# Patient Record
Sex: Female | Born: 1975
Health system: Southern US, Community
[De-identification: ages and names within clinical notes are randomized; demographics above are authoritative.]

## PROBLEM LIST (undated history)

## (undated) DIAGNOSIS — K219 Gastro-esophageal reflux disease without esophagitis: Secondary | ICD-10-CM

## (undated) DIAGNOSIS — K5909 Other constipation: Secondary | ICD-10-CM

## (undated) DIAGNOSIS — Z8619 Personal history of other infectious and parasitic diseases: Secondary | ICD-10-CM

## (undated) DIAGNOSIS — R739 Hyperglycemia, unspecified: Secondary | ICD-10-CM

## (undated) DIAGNOSIS — K589 Irritable bowel syndrome without diarrhea: Secondary | ICD-10-CM

## (undated) DIAGNOSIS — T7840XA Allergy, unspecified, initial encounter: Secondary | ICD-10-CM

## (undated) HISTORY — PX: OTHER SURGICAL HISTORY: SHX169

## (undated) HISTORY — PX: COLONOSCOPY: SHX174

## (undated) HISTORY — DX: Gastro-esophageal reflux disease without esophagitis: K21.9

## (undated) HISTORY — DX: Personal history of other infectious and parasitic diseases: Z86.19

## (undated) HISTORY — DX: Hyperglycemia, unspecified: R73.9

## (undated) HISTORY — PX: BREAST REDUCTION SURGERY: SHX8

## (undated) HISTORY — DX: Irritable bowel syndrome, unspecified: K58.9

## (undated) HISTORY — DX: Other constipation: K59.09

## (undated) HISTORY — DX: Allergy, unspecified, initial encounter: T78.40XA

---

## 2000-01-04 HISTORY — PX: BREAST SURGERY: SHX581

## 2000-02-17 ENCOUNTER — Encounter: Payer: Self-pay | Admitting: Family Medicine

## 2000-02-17 ENCOUNTER — Encounter: Admission: RE | Admit: 2000-02-17 | Discharge: 2000-02-17 | Payer: Self-pay | Admitting: Family Medicine

## 2000-05-22 ENCOUNTER — Encounter (INDEPENDENT_AMBULATORY_CARE_PROVIDER_SITE_OTHER): Payer: Self-pay

## 2000-05-22 ENCOUNTER — Other Ambulatory Visit: Admission: RE | Admit: 2000-05-22 | Discharge: 2000-05-22 | Payer: Self-pay | Admitting: Plastic Surgery

## 2001-02-13 ENCOUNTER — Other Ambulatory Visit: Admission: RE | Admit: 2001-02-13 | Discharge: 2001-02-13 | Payer: Self-pay | Admitting: Obstetrics and Gynecology

## 2001-07-27 ENCOUNTER — Emergency Department (HOSPITAL_COMMUNITY): Admission: EM | Admit: 2001-07-27 | Discharge: 2001-07-28 | Payer: Self-pay | Admitting: Emergency Medicine

## 2001-07-28 ENCOUNTER — Encounter: Payer: Self-pay | Admitting: Emergency Medicine

## 2001-08-31 ENCOUNTER — Ambulatory Visit (HOSPITAL_COMMUNITY): Admission: RE | Admit: 2001-08-31 | Discharge: 2001-08-31 | Payer: Self-pay | Admitting: Family Medicine

## 2001-08-31 ENCOUNTER — Encounter: Payer: Self-pay | Admitting: Family Medicine

## 2002-01-03 HISTORY — PX: TONSILLECTOMY: SUR1361

## 2002-03-04 ENCOUNTER — Other Ambulatory Visit: Admission: RE | Admit: 2002-03-04 | Discharge: 2002-03-04 | Payer: Self-pay | Admitting: Obstetrics and Gynecology

## 2002-04-03 ENCOUNTER — Encounter: Payer: Self-pay | Admitting: Family Medicine

## 2002-04-03 ENCOUNTER — Ambulatory Visit (HOSPITAL_COMMUNITY): Admission: RE | Admit: 2002-04-03 | Discharge: 2002-04-03 | Payer: Self-pay | Admitting: Family Medicine

## 2002-06-29 ENCOUNTER — Emergency Department (HOSPITAL_COMMUNITY): Admission: EM | Admit: 2002-06-29 | Discharge: 2002-06-29 | Payer: Self-pay

## 2002-11-01 ENCOUNTER — Ambulatory Visit (HOSPITAL_COMMUNITY): Admission: RE | Admit: 2002-11-01 | Discharge: 2002-11-01 | Payer: Self-pay | Admitting: Family Medicine

## 2002-12-31 ENCOUNTER — Ambulatory Visit (HOSPITAL_COMMUNITY): Admission: RE | Admit: 2002-12-31 | Discharge: 2002-12-31 | Payer: Self-pay | Admitting: Family Medicine

## 2003-04-25 ENCOUNTER — Other Ambulatory Visit: Admission: RE | Admit: 2003-04-25 | Discharge: 2003-04-25 | Payer: Self-pay | Admitting: Obstetrics and Gynecology

## 2003-05-13 ENCOUNTER — Other Ambulatory Visit: Admission: RE | Admit: 2003-05-13 | Discharge: 2003-05-13 | Payer: Self-pay | Admitting: Obstetrics and Gynecology

## 2004-01-04 HISTORY — PX: OTHER SURGICAL HISTORY: SHX169

## 2004-01-28 ENCOUNTER — Ambulatory Visit: Payer: Self-pay | Admitting: Family Medicine

## 2004-03-03 ENCOUNTER — Ambulatory Visit (HOSPITAL_COMMUNITY): Admission: RE | Admit: 2004-03-03 | Discharge: 2004-03-03 | Payer: Self-pay | Admitting: Podiatry

## 2004-05-17 ENCOUNTER — Emergency Department (HOSPITAL_COMMUNITY): Admission: EM | Admit: 2004-05-17 | Discharge: 2004-05-17 | Payer: Self-pay | Admitting: Emergency Medicine

## 2004-05-27 ENCOUNTER — Ambulatory Visit: Payer: Self-pay | Admitting: Family Medicine

## 2004-08-23 ENCOUNTER — Ambulatory Visit: Payer: Self-pay | Admitting: Family Medicine

## 2004-08-24 ENCOUNTER — Other Ambulatory Visit: Admission: RE | Admit: 2004-08-24 | Discharge: 2004-08-24 | Payer: Self-pay | Admitting: Obstetrics and Gynecology

## 2004-09-02 ENCOUNTER — Other Ambulatory Visit: Admission: RE | Admit: 2004-09-02 | Discharge: 2004-09-02 | Payer: Self-pay | Admitting: Obstetrics and Gynecology

## 2004-10-08 ENCOUNTER — Ambulatory Visit: Payer: Self-pay | Admitting: Family Medicine

## 2005-03-10 ENCOUNTER — Ambulatory Visit: Payer: Self-pay | Admitting: Family Medicine

## 2005-06-30 ENCOUNTER — Encounter: Payer: Self-pay | Admitting: Family Medicine

## 2005-06-30 ENCOUNTER — Ambulatory Visit: Payer: Self-pay | Admitting: Family Medicine

## 2005-06-30 ENCOUNTER — Encounter (INDEPENDENT_AMBULATORY_CARE_PROVIDER_SITE_OTHER): Payer: Self-pay | Admitting: *Deleted

## 2005-06-30 ENCOUNTER — Other Ambulatory Visit: Admission: RE | Admit: 2005-06-30 | Discharge: 2005-06-30 | Payer: Self-pay | Admitting: Family Medicine

## 2005-09-01 ENCOUNTER — Ambulatory Visit: Payer: Self-pay | Admitting: Family Medicine

## 2006-04-10 ENCOUNTER — Ambulatory Visit: Payer: Self-pay | Admitting: Family Medicine

## 2006-04-10 LAB — CONVERTED CEMR LAB
Basophils Absolute: 0.2 10*3/uL — ABNORMAL HIGH (ref 0.0–0.1)
Basophils Relative: 2 % — ABNORMAL HIGH (ref 0–1)
CO2: 24 meq/L (ref 19–32)
Calcium: 8.7 mg/dL (ref 8.4–10.5)
Chloride: 105 meq/L (ref 96–112)
Creatinine, Ser: 0.76 mg/dL (ref 0.40–1.20)
Eosinophils Absolute: 0.1 10*3/uL (ref 0.0–0.7)
HCT: 40.9 % (ref 36.0–46.0)
HDL: 40 mg/dL (ref >39)
LDL Cholesterol: 77 mg/dL (ref 0–99)
MCV: 83.6 fL (ref 78.0–100.0)
Platelets: 509 10*3/uL — ABNORMAL HIGH (ref 150–400)
Potassium: 4.5 meq/L (ref 3.5–5.3)
RBC: 4.89 M/uL (ref 3.87–5.11)
Total CHOL/HDL Ratio: 3.3
VLDL: 13 mg/dL (ref 0–40)

## 2006-05-17 ENCOUNTER — Ambulatory Visit: Payer: Self-pay | Admitting: Family Medicine

## 2006-06-14 ENCOUNTER — Encounter (HOSPITAL_COMMUNITY): Admission: RE | Admit: 2006-06-14 | Discharge: 2006-07-14 | Payer: Self-pay | Admitting: Oncology

## 2006-06-14 ENCOUNTER — Ambulatory Visit (HOSPITAL_COMMUNITY): Payer: Self-pay | Admitting: Oncology

## 2006-07-27 ENCOUNTER — Ambulatory Visit: Payer: Self-pay | Admitting: Family Medicine

## 2006-09-20 ENCOUNTER — Ambulatory Visit (HOSPITAL_COMMUNITY): Payer: Self-pay | Admitting: Oncology

## 2006-09-20 ENCOUNTER — Encounter (HOSPITAL_COMMUNITY): Admission: RE | Admit: 2006-09-20 | Discharge: 2006-10-03 | Payer: Self-pay | Admitting: Oncology

## 2006-11-02 ENCOUNTER — Ambulatory Visit: Payer: Self-pay | Admitting: Family Medicine

## 2006-11-16 ENCOUNTER — Encounter (INDEPENDENT_AMBULATORY_CARE_PROVIDER_SITE_OTHER): Payer: Self-pay | Admitting: *Deleted

## 2006-11-16 ENCOUNTER — Ambulatory Visit: Payer: Self-pay | Admitting: Family Medicine

## 2006-11-16 ENCOUNTER — Encounter: Payer: Self-pay | Admitting: Family Medicine

## 2006-11-16 ENCOUNTER — Other Ambulatory Visit: Admission: RE | Admit: 2006-11-16 | Discharge: 2006-11-16 | Payer: Self-pay | Admitting: Family Medicine

## 2006-12-01 ENCOUNTER — Emergency Department (HOSPITAL_COMMUNITY): Admission: EM | Admit: 2006-12-01 | Discharge: 2006-12-01 | Payer: Self-pay | Admitting: Emergency Medicine

## 2007-05-23 ENCOUNTER — Ambulatory Visit: Payer: Self-pay | Admitting: Family Medicine

## 2007-05-29 ENCOUNTER — Encounter (INDEPENDENT_AMBULATORY_CARE_PROVIDER_SITE_OTHER): Payer: Self-pay | Admitting: *Deleted

## 2007-05-29 DIAGNOSIS — E669 Obesity, unspecified: Secondary | ICD-10-CM | POA: Insufficient documentation

## 2007-05-29 DIAGNOSIS — K219 Gastro-esophageal reflux disease without esophagitis: Secondary | ICD-10-CM | POA: Insufficient documentation

## 2007-05-29 DIAGNOSIS — K5900 Constipation, unspecified: Secondary | ICD-10-CM | POA: Insufficient documentation

## 2007-08-14 ENCOUNTER — Telehealth: Payer: Self-pay | Admitting: Family Medicine

## 2007-11-05 ENCOUNTER — Ambulatory Visit: Payer: Self-pay | Admitting: Family Medicine

## 2007-11-13 ENCOUNTER — Ambulatory Visit: Payer: Self-pay | Admitting: Family Medicine

## 2007-11-13 DIAGNOSIS — J019 Acute sinusitis, unspecified: Secondary | ICD-10-CM | POA: Insufficient documentation

## 2007-11-13 DIAGNOSIS — M549 Dorsalgia, unspecified: Secondary | ICD-10-CM | POA: Insufficient documentation

## 2007-11-13 DIAGNOSIS — J0120 Acute ethmoidal sinusitis, unspecified: Secondary | ICD-10-CM | POA: Insufficient documentation

## 2007-11-13 DIAGNOSIS — N912 Amenorrhea, unspecified: Secondary | ICD-10-CM | POA: Insufficient documentation

## 2007-12-17 ENCOUNTER — Telehealth: Payer: Self-pay | Admitting: Family Medicine

## 2008-01-02 ENCOUNTER — Telehealth: Payer: Self-pay | Admitting: Family Medicine

## 2008-07-16 ENCOUNTER — Ambulatory Visit: Payer: Self-pay | Admitting: Family Medicine

## 2008-07-16 DIAGNOSIS — J209 Acute bronchitis, unspecified: Secondary | ICD-10-CM | POA: Insufficient documentation

## 2008-12-11 ENCOUNTER — Other Ambulatory Visit: Admission: RE | Admit: 2008-12-11 | Discharge: 2008-12-11 | Payer: Self-pay | Admitting: Family Medicine

## 2008-12-11 ENCOUNTER — Ambulatory Visit: Payer: Self-pay | Admitting: Family Medicine

## 2008-12-11 DIAGNOSIS — R5381 Other malaise: Secondary | ICD-10-CM | POA: Insufficient documentation

## 2008-12-11 DIAGNOSIS — I889 Nonspecific lymphadenitis, unspecified: Secondary | ICD-10-CM | POA: Insufficient documentation

## 2008-12-11 DIAGNOSIS — R5383 Other fatigue: Secondary | ICD-10-CM

## 2008-12-11 LAB — CONVERTED CEMR LAB: Beta hcg, urine, semiquantitative: NEGATIVE

## 2009-06-24 ENCOUNTER — Emergency Department (HOSPITAL_COMMUNITY): Admission: EM | Admit: 2009-06-24 | Discharge: 2009-06-24 | Payer: Self-pay | Admitting: Emergency Medicine

## 2009-06-25 ENCOUNTER — Telehealth: Payer: Self-pay | Admitting: Family Medicine

## 2009-12-03 ENCOUNTER — Telehealth: Payer: Self-pay | Admitting: Family Medicine

## 2010-01-24 ENCOUNTER — Encounter: Payer: Self-pay | Admitting: Family Medicine

## 2010-02-02 NOTE — Progress Notes (Signed)
  Phone Note Call from Patient   Caller: Patient Summary of Call: patient states she has glands in her private area swollen on each side, started two days ago patient is taking monistat for yeast, what could cause the swelling Initial call taken by: Adella Hare LPN,  December 03, 2009 4:44 PM  Follow-up for Phone Call        swollen glands happen fighting infection, and should go down once her infection clears, pls let her know Follow-up by: Syliva Overman MD,  December 03, 2009 5:38 PM  Additional Follow-up for Phone Call Additional follow up Details #1::        returned call, left message Additional Follow-up by: Adella Hare LPN,  December 04, 2009 3:49 PM    Additional Follow-up for Phone Call Additional follow up Details #2::    patient aware Follow-up by: Adella Hare LPN,  December 08, 2009 12:56 PM

## 2010-02-02 NOTE — Progress Notes (Signed)
Summary: speak with nurse  Phone Note Call from Patient   Summary of Call: [phone] needs to speak doc about going to er and diag with anixety  Initial call taken by: Rudene Anda,  June 25, 2009 8:42 AM  Follow-up for Phone Call        pls find out wghat she wants me to hear and ley me know,  Follow-up by: Syliva Overman MD,  June 25, 2009 12:00 PM  Additional Follow-up for Phone Call Additional follow up Details #1::        returned call, no answer Additional Follow-up by: Adella Hare LPN,  June 25, 2009 12:09 PM    Additional Follow-up for Phone Call Additional follow up Details #2::    patient states she does not need anything at this time and will call in future for follow up Follow-up by: Adella Hare LPN,  June 26, 2009 2:20 PM

## 2010-02-02 NOTE — Progress Notes (Signed)
Summary: Please advise  Phone Note Call from Patient   Summary of Call: Patient states she is itching in her vaginal area it started yesterday, no pain during urination.  Please advise. Initial call taken by: Curtis Sites,  December 03, 2009 8:12 AM  Follow-up for Phone Call        if itching only and no discharge or odor needs to try otc monistat, if this does not help she needs ov  called patient, left message Follow-up by: Adella Hare LPN,  December 03, 2009 4:32 PM  Additional Follow-up for Phone Call Additional follow up Details #1::        patient aware Additional Follow-up by: Adella Hare LPN,  December 03, 2009 4:42 PM

## 2010-03-21 LAB — BASIC METABOLIC PANEL
BUN: 6 mg/dL (ref 6–23)
Chloride: 104 mEq/L (ref 96–112)
GFR calc Af Amer: >60 mL/min (ref >60)
Glucose, Bld: 101 mg/dL — ABNORMAL HIGH (ref 70–99)

## 2010-03-21 LAB — DIFFERENTIAL
Basophils Absolute: 0 10*3/uL (ref 0.0–0.1)
Basophils Relative: 0 % (ref 0–1)
Monocytes Absolute: 0.3 10*3/uL (ref 0.1–1.0)
Neutro Abs: 6.4 10*3/uL (ref 1.7–7.7)
Neutrophils Relative %: 75 % (ref 43–77)

## 2010-03-21 LAB — CBC
HCT: 38.4 % (ref 36.0–46.0)
Hemoglobin: 12.3 g/dL (ref 12.0–15.0)
RDW: 18 % — ABNORMAL HIGH (ref 11.5–15.5)
WBC: 8.6 10*3/uL (ref 4.0–10.5)

## 2010-04-04 ENCOUNTER — Emergency Department (HOSPITAL_COMMUNITY)
Admission: EM | Admit: 2010-04-04 | Discharge: 2010-04-04 | Disposition: A | Discharge status: Home / Self Care | Payer: BC Managed Care – PPO | Attending: Emergency Medicine | Admitting: Emergency Medicine

## 2010-04-04 DIAGNOSIS — W57XXXA Bitten or stung by nonvenomous insect and other nonvenomous arthropods, initial encounter: Secondary | ICD-10-CM | POA: Insufficient documentation

## 2010-04-04 DIAGNOSIS — M7989 Other specified soft tissue disorders: Secondary | ICD-10-CM | POA: Insufficient documentation

## 2010-04-04 DIAGNOSIS — K219 Gastro-esophageal reflux disease without esophagitis: Secondary | ICD-10-CM | POA: Insufficient documentation

## 2010-04-04 DIAGNOSIS — S90569A Insect bite (nonvenomous), unspecified ankle, initial encounter: Secondary | ICD-10-CM | POA: Insufficient documentation

## 2010-04-04 DIAGNOSIS — L298 Other pruritus: Secondary | ICD-10-CM | POA: Insufficient documentation

## 2010-04-04 DIAGNOSIS — Z79899 Other long term (current) drug therapy: Secondary | ICD-10-CM | POA: Insufficient documentation

## 2010-04-04 DIAGNOSIS — K589 Irritable bowel syndrome without diarrhea: Secondary | ICD-10-CM | POA: Insufficient documentation

## 2010-04-04 DIAGNOSIS — L2989 Other pruritus: Secondary | ICD-10-CM | POA: Insufficient documentation

## 2010-05-21 NOTE — H&P (Signed)
[name], Jackson          ACCOUNT NO.:  [id number]   MEDICAL RECORD NO.:  [id number]          PATIENT TYPE:  AMB   LOCATION:  DAY                           FACILITY:  [facility]   PHYSICIAN:  Cody M. Ulice Brilliant, D.P.M.  DATE OF BIRTH:  [birth year]   DATE OF ADMISSION:  DATE OF DISCHARGE:  LH                                HISTORY & PHYSICAL   HISTORY OF PRESENT ILLNESS:  [name] is a 35 year old black female who has  a painful bunion deformity of her left foot.  This condition has been  present for several months to years.  She has treated this conservatively  with increasing the width of her shoes, trying to wear shoes that do not rub  the area.  I had seen her previously in June of 2005 and discussed the  condition.  Had put her on Celebrex 200 mg q.d. and then with a follow up I  injected the first MTP which gave her relief for a few months only to have  it come back to bother her again.   PAST MEDICAL HISTORY:  Essentially unremarkable.  She has a history of  reflux for which she takes Nexium.  She takes ibuprofen as needed for her  headaches and has a history of seasonal allergies which she takes Careers adviser.   PAST SURGICAL HISTORY:  1.  Breast reduction surgery.  2.  Tonsillectomy.  3.  She has had previous right ankle surgery.   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  Nonsmoker.  Occasional alcohol drinker.   PHYSICAL EXAMINATION:  Objectively, HAV deformity of the left foot with  prominent medial eminence.  The great toe is starting to abutted the second  toe.  Range of motion of the first MTP is mildly restricted.  She is  uncomfortable to deep palpation around the first MTP especially dorsally and  medially.  Radiographs reveal a mild increase in the IM angle with an  enlarged medial eminence.   ASSESSMENT:  HAV deformity of the left, painful.   PLAN:  The patient has done well with conservative measures.  She still has  discomfort in the area.  She would like to go ahead and get this corrected.  This will consistent of an Austin bunionectomy left foot under MAC  anesthesia.  I have discussed the procedure with [facility], she has read her  consent form, apparently understood and signed. We have discussed about the  usual postoperative course and the possibility of complications which  include but are not  limited to swelling, pain postoperatively, slow bone healing, possibility of  infection, possibility of numbness in the surgical area.  As stated,  [name] has participated in the discussion, has read her consent form and  to my understanding has understood it and signed.      CMD/MEDQ  D:  03/01/2004  T:  03/01/2004  Job:  [id number]

## 2010-05-21 NOTE — Op Note (Signed)
NAMEFREADA, [name]          ACCOUNT NO.:  [id number]   MEDICAL RECORD NO.:  [id number]          PATIENT TYPE:  AMB   LOCATION:  DAY                           FACILITY:  [facility]   PHYSICIAN:  Cody M. Ulice Brilliant, D.P.M.  DATE OF BIRTH:  [birth year]   DATE OF PROCEDURE:  03/03/2004  DATE OF DISCHARGE:                                 OPERATIVE REPORT   PREOPERATIVE DIAGNOSIS:  Hallux abductovalgus deformity, left foot.   POSTOPERATIVE DIAGNOSIS:  Hallux abductovalgus deformity, left foot.   PROCEDURE PERFORMED:  Austin bunionectomy, left foot.   SURGEON:  Denny Peon. Ulice Brilliant, D.P.M.   ANESTHESIA:  Monitored anesthesia care.   ANESTHESIOLOGIST:  Dr. Jayme Cloud.   INDICATIONS FOR PROCEDURE:  Long-standing painful bunion deformity of left  foot, unresponsive to conservative measures including wider shoes, injection  therapy x1, and one month of anti-inflammatory medication. Relates that she  is unable to wear an enclosed shoe without discomfort. The patient  clinically has a pronounced HAV deformity with pain to palpation. She has  requested surgical correction.   DESCRIPTION OF PROCEDURE:  [name] is brought into the OR and placed  on the table in supine position. IV sedation is established. A Mayo block is  performed about the first MTP of the left foot. Further local anesthesia is  administered around the plantar aspect of the first metatarsal. A pneumatic  ankle tourniquet was applied across her left ankle. Her foot is prepped and  draped in the usual aseptic fashion. An ACE bandage utilized to exsanguinate  her foot. Tourniquet inflated to 250 mmHg.   PROCEDURE:  Austin bunionectomy, left foot.   Attention is directed to the first MTP of this left foot. A 7-cm curvilinear  skin incision is made. The incision is deepened via sharp and blunt  dissection. Care is taken to get a good exposure to the medial aspect of the  joint capsule, and inverted L capsulotomy is performed. The  capsule and  periosteal tissues were reflected away from the underlying bony surface. The  medial eminence is delivered into the surgical wound and resected utilizing  a #63 blade on the oscillating saw. Attention is then directed into the  first interspace through the original skin incision, and adductor hallucis  tenotomy is performed. Attention is then directed back to the first  metatarsal head. A 0.045 K wire was  introduced and utilized to access the  pin access guide. With the K wire in place, the osteotomy cuts were made,  and the capital fragment is then relocated laterally approximately 50%  across the width of the metatarsal surface. This is then fixated via two  0.045 K wires driven in a slightly diversion fashion. The osteotomy is  deemed stable. The wound is flushed with copious amounts of irrigant.  Redundant medial bone is excised, and the newly formed osseous surface is  rasped smooth. The K wires were bent close to the metatarsal surface cut and  then rotated so that they lay flush. The wound again is flushed with copious  amounts of irrigant. Capsular tissues are reapproximated and closed  utilizing 3-0 Vicryl. Subcutaneous tissues  were reapproximated and closed  utilizing 4-0 Vicryl. Skin is closed with 4-0 Vicryl and a subcuticular  suture. Steri-Strips were applied across the wound. A postoperative  injection of Marcaine and Hexadrol was dispensed. Betadine-soaked Adaptic  dressing and dry sterile compression dressing followed. Tourniquet is  deflated with tourniquet time spanning 45 minutes.   Postoperative images are obtained with the ________________ scan. The  patient is transported to recovery without incident. There are a list of  written instructions all explained to her and her mother. A prescription for  Tylox is dispensed. A prescription for Phenergan 25 mg is dispensed. A cam  walker for immediate use for the next four weeks is dispensed along a Darco   surgical shoe for later use. A first postoperative visit for one week is  dispensed. She will be seen as stated in seven days for first postoperative  visit.      CMD/MEDQ  D:  03/03/2004  T:  03/03/2004  Job:  [id number]

## 2010-10-14 LAB — DIFFERENTIAL
Basophils Absolute: 0.1
Eosinophils Absolute: 0.2
Lymphocytes Relative: 36
Lymphs Abs: 2.7
Neutro Abs: 4.1
Neutrophils Relative %: 55

## 2010-10-14 LAB — CBC
MCHC: 32.7
MCV: 82.7
Platelets: 369

## 2010-10-21 LAB — CBC
HCT: 39.6
Hemoglobin: 13.3
MCHC: 33.7
RBC: 4.78

## 2010-10-21 LAB — DIFFERENTIAL
Basophils Absolute: 0.1
Basophils Relative: 1
Lymphocytes Relative: 27
Lymphs Abs: 1.9
Neutrophils Relative %: 70

## 2010-10-21 LAB — IRON AND TIBC
Iron: 194 — ABNORMAL HIGH
UIBC: 219

## 2010-10-29 ENCOUNTER — Encounter: Payer: Self-pay | Admitting: Family Medicine

## 2010-11-03 ENCOUNTER — Other Ambulatory Visit (HOSPITAL_COMMUNITY)
Admission: RE | Admit: 2010-11-03 | Discharge: 2010-11-03 | Disposition: A | Discharge status: Home / Self Care | Payer: Self-pay | Source: Ambulatory Visit | Attending: Family Medicine | Admitting: Family Medicine

## 2010-11-03 ENCOUNTER — Encounter: Payer: Self-pay | Admitting: Family Medicine

## 2010-11-03 ENCOUNTER — Ambulatory Visit (INDEPENDENT_AMBULATORY_CARE_PROVIDER_SITE_OTHER): Payer: Self-pay | Admitting: Family Medicine

## 2010-11-03 VITALS — BP 120/70 | HR 79 | Resp 16 | Ht 63.0 in | Wt 198.8 lb

## 2010-11-03 DIAGNOSIS — E669 Obesity, unspecified: Secondary | ICD-10-CM

## 2010-11-03 DIAGNOSIS — N760 Acute vaginitis: Secondary | ICD-10-CM

## 2010-11-03 DIAGNOSIS — Z01419 Encounter for gynecological examination (general) (routine) without abnormal findings: Secondary | ICD-10-CM | POA: Insufficient documentation

## 2010-11-03 DIAGNOSIS — J019 Acute sinusitis, unspecified: Secondary | ICD-10-CM

## 2010-11-03 DIAGNOSIS — Z124 Encounter for screening for malignant neoplasm of cervix: Secondary | ICD-10-CM

## 2010-11-03 MED ORDER — SULFAMETHOXAZOLE-TRIMETHOPRIM 800-160 MG PO TABS: 1.0000 | ORAL_TABLET | Freq: Two times a day (BID) | ORAL | Status: AC

## 2010-11-03 MED ORDER — FLUCONAZOLE 150 MG PO TABS: 150.0000 mg | ORAL_TABLET | Freq: Once | ORAL | Status: AC

## 2010-11-03 MED ORDER — SULFAMETHOXAZOLE-TRIMETHOPRIM 800-160 MG PO TABS: 1.0000 | ORAL_TABLET | Freq: Two times a day (BID) | ORAL | Status: DC

## 2010-11-03 NOTE — Patient Instructions (Signed)
CPE in 1 year.  It is important that you exercise regularly at least 60 minutes6 times a week. If you develop chest pain, have severe difficulty breathing, or feel very tired, stop exercising immediately and seek medical attention    A healthy diet is rich in fruit, vegetables and whole grains. Poultry fish, nuts and beans are a healthy choice for protein rather then red meat. A low sodium diet and drinking 64 ounces of water daily is generally recommended. Oils and sweet should be limited. Carbohydrates especially for those who are diabetic or overweight, should be limited to 30-45 gram per meal. It is important to eat on a regular schedule, at least 3 times daily. Snacks should be primarily fruits, vegetables or nuts.  Weight loss goal of 3 to 4 pounds per month.  Antibiotic and fluconazole sent in for sinus infection  1

## 2010-11-04 DIAGNOSIS — N760 Acute vaginitis: Secondary | ICD-10-CM | POA: Insufficient documentation

## 2010-11-04 LAB — WET PREP BY MOLECULAR PROBE: Candida species: NEGATIVE

## 2010-11-04 NOTE — Assessment & Plan Note (Signed)
Antibiotic course prescribed 

## 2010-11-04 NOTE — Assessment & Plan Note (Signed)
Improved. Pt applauded on succesful weight loss through lifestyle change, and encouraged to continue same. Weight loss goal set for the next several months.  

## 2010-11-04 NOTE — Progress Notes (Signed)
  Subjective:    Patient ID: [name], female    DOB: 03-19-1975, 35 y.o.   MRN: [id number]  HPI The PT is here for annual exam .  C/o 5 day h/o maxillary sinus pressure with brown post nasal drainage, denies fever, chills, ear pain or sore throat. She had been exercising regularly with good weight loss, currently unemployed and plans to resume    Review of Systems See HPI Denies chest congestion, productive cough or wheezing. Denies chest pains, palpitations and leg swelling Denies abdominal pain, nausea, vomiting,diarrhea or constipation.   Denies dysuria, frequency, hesitancy or incontinence.c/o increased sensation and sensitivity in the genital area Denies joint pain, swelling and limitation in mobility. Denies headaches, seizures, numbness, or tingling. Denies depression, anxiety or insomnia. Denies skin break down or rash.        Objective:   Physical Exam  Pleasant well nourished female, alert and oriented x 3, in no cardio-pulmonary distress. Afebrile. HEENT No facial trauma or asymetry.Maxillary  Sinuses  tender.  EOMI, PERTL, fundoscopic exam is normal, no hemorhage or exudate.  External ears normal, tympanic membranes clear. Oropharynx moist, no exudate, good dentition. Neck: supple, no adenopathy,JVD or thyromegaly.No bruits.  Chest: Clear to ascultation bilaterally.No crackles or wheezes. Non tender to palpation  Breast: No asymetry,no masses. No nipple discharge or inversion. No axillary or supraclavicular adenopathy  Cardiovascular system; Heart sounds normal,  S1 and  S2 ,no S3.  No murmur, or thrill. Apical beat not displaced Peripheral pulses normal.  Abdomen: Soft, non tender, no organomegaly or masses. No bruits. Bowel sounds normal. No guarding, tenderness or rebound.   GU: External genitalia normal. No lesions. Vaginal canal normal.White  discharge. Uterus normal size, no adnexal masses, no cervical motion or adnexal  tenderness.  Musculoskeletal exam: Full ROM of spine, hips , shoulders and knees. No deformity ,swelling or crepitus noted. No muscle wasting or atrophy.   Neurologic: Cranial nerves 2 to 12 intact. Power, tone ,sensation and reflexes normal throughout. No disturbance in gait. No tremor.  Skin: Intact, no ulceration, erythema , scaling or rash noted. Pigmentation normal throughout  Psych; Normal mood and affect. Judgement and concentration normal       Assessment & Plan:

## 2010-11-04 NOTE — Assessment & Plan Note (Signed)
Specimens sent for wet prep and culture 

## 2010-11-08 ENCOUNTER — Encounter: Payer: Self-pay | Admitting: *Deleted

## 2010-11-11 ENCOUNTER — Telehealth: Payer: Self-pay | Admitting: Family Medicine

## 2010-11-12 ENCOUNTER — Other Ambulatory Visit: Payer: Self-pay | Admitting: Family Medicine

## 2010-11-12 MED ORDER — PENICILLIN V POTASSIUM 500 MG PO TABS: 500.0000 mg | ORAL_TABLET | Freq: Three times a day (TID) | ORAL | Status: AC

## 2010-11-12 NOTE — Telephone Encounter (Signed)
7 day course of septra was prescribed, pt advised that penicillin for 2 weeks has been sent in

## 2010-11-18 ENCOUNTER — Telehealth: Payer: Self-pay | Admitting: Family Medicine

## 2010-11-19 NOTE — Telephone Encounter (Signed)
[name] has spoke with patient this morning

## 2011-11-03 ENCOUNTER — Encounter: Payer: Self-pay | Admitting: Family Medicine

## 2011-12-09 ENCOUNTER — Encounter (HOSPITAL_COMMUNITY): Payer: Self-pay | Admitting: *Deleted

## 2011-12-09 ENCOUNTER — Emergency Department (HOSPITAL_COMMUNITY): Payer: Self-pay

## 2011-12-09 ENCOUNTER — Emergency Department (HOSPITAL_COMMUNITY)
Admission: EM | Admit: 2011-12-09 | Discharge: 2011-12-09 | Disposition: A | Discharge status: Home / Self Care | Payer: Self-pay | Attending: Emergency Medicine | Admitting: Emergency Medicine

## 2011-12-09 DIAGNOSIS — M25569 Pain in unspecified knee: Secondary | ICD-10-CM | POA: Insufficient documentation

## 2011-12-09 DIAGNOSIS — Z79899 Other long term (current) drug therapy: Secondary | ICD-10-CM | POA: Insufficient documentation

## 2011-12-09 DIAGNOSIS — K5900 Constipation, unspecified: Secondary | ICD-10-CM | POA: Insufficient documentation

## 2011-12-09 DIAGNOSIS — Z9889 Other specified postprocedural states: Secondary | ICD-10-CM | POA: Insufficient documentation

## 2011-12-09 DIAGNOSIS — M25562 Pain in left knee: Secondary | ICD-10-CM

## 2011-12-09 DIAGNOSIS — K219 Gastro-esophageal reflux disease without esophagitis: Secondary | ICD-10-CM | POA: Insufficient documentation

## 2011-12-09 MED ORDER — DICLOFENAC SODIUM 75 MG PO TBEC: 75.0000 mg | DELAYED_RELEASE_TABLET | Freq: Two times a day (BID) | ORAL | Status: DC

## 2011-12-09 MED ORDER — KETOROLAC TROMETHAMINE 10 MG PO TABS
10.0000 mg | ORAL_TABLET | Freq: Once | ORAL | Status: AC
  Administered 2011-12-09: 10 mg via ORAL
  Filled 2011-12-09: qty 1

## 2011-12-09 NOTE — ED Notes (Signed)
Lt knee pain for 1 month, worse since Sunday.  On Wed , heard a pop, and worse since then.

## 2011-12-10 NOTE — ED Provider Notes (Signed)
History     CSN: [id number]  Arrival date & time 12/09/11  1910   First MD Initiated Contact with Patient 12/09/11 2007      Chief Complaint  Patient presents with  . Knee Pain    (Consider location/radiation/quality/duration/timing/severity/associated sxs/prior treatment) Patient is a 36 y.o. female presenting with knee pain. The history is provided by the patient.  Knee Pain This is a new problem. The current episode started more than 1 month ago. The problem occurs daily. The problem has been gradually worsening. Associated symptoms include arthralgias. Pertinent negatives include no abdominal pain, chest pain, coughing or neck pain. The symptoms are aggravated by standing, walking and exertion. She has tried acetaminophen (analgesic rub.) for the symptoms. The treatment provided no relief.    Past Medical History  Diagnosis Date  . GERD (gastroesophageal reflux disease)   . Chronic constipation     Past Surgical History  Procedure Date  . Breast reduction surgery   . Right ankle   . Tonsillectomy     History reviewed. No pertinent family history.  History  Substance Use Topics  . Smoking status: Never Smoker   . Smokeless tobacco: Not on file  . Alcohol Use: Yes     Comment: occ    OB History    Grav Para Term Preterm Abortions TAB SAB Ect Mult Living                  Review of Systems  Constitutional: Negative for activity change.       All ROS Neg except as noted in HPI  HENT: Negative for nosebleeds and neck pain.   Eyes: Negative for photophobia and discharge.  Respiratory: Negative for cough, shortness of breath and wheezing.   Cardiovascular: Negative for chest pain and palpitations.  Gastrointestinal: Negative for abdominal pain and blood in stool.  Genitourinary: Negative for dysuria, frequency and hematuria.  Musculoskeletal: Positive for arthralgias. Negative for back pain.  Skin: Negative.   Neurological: Negative for dizziness, seizures and  speech difficulty.  Psychiatric/Behavioral: Negative for hallucinations and confusion.    Allergies  Shellfish allergy  Home Medications   Current Outpatient Rx  Name  Route  Sig  Dispense  Refill  . POLYETHYLENE GLYCOL 3350 PO PACK   Oral   Take 17 g by mouth daily.           Marland Kitchen ZINC GLUCONATE 13.3 MG MT LOZG   Mouth/Throat   Use as directed 1 lozenge in the mouth or throat daily as needed. For cold symptoms         . DICLOFENAC SODIUM 75 MG PO TBEC   Oral   Take 1 tablet (75 mg total) by mouth 2 (two) times daily.   12 tablet   0     Please take with food     BP 119/80  Pulse 76  Temp 98.7 F (37.1 C) (Oral)  Resp 18  Ht 5\' 3"  (1.6 m)  Wt 184 lb (83.462 kg)  BMI 32.59 kg/m2  SpO2 100%  LMP 11/30/2011  Physical Exam  Nursing note and vitals reviewed. Constitutional: She is oriented to person, place, and time. She appears well-developed and well-nourished.  Non-toxic appearance.  HENT:  Head: Normocephalic.  Right Ear: Tympanic membrane and external ear normal.  Left Ear: Tympanic membrane and external ear normal.  Eyes: EOM and lids are normal. Pupils are equal, round, and reactive to light.  Neck: Normal range of motion. Neck supple. Carotid bruit is  not present.  Cardiovascular: Normal rate, regular rhythm, normal heart sounds, intact distal pulses and normal pulses.   Pulmonary/Chest: Breath sounds normal. No respiratory distress.  Abdominal: Soft. Bowel sounds are normal. There is no tenderness. There is no guarding.  Musculoskeletal: Normal range of motion.       There is medial soreness of the right knee. No effusion appreciated. Minimal crepitus noted. No posterior mass appreciated. No hot areas noted. Full range of motion of the right ankle and toes.  Lymphadenopathy:       Head (right side): No submandibular adenopathy present.       Head (left side): No submandibular adenopathy present.    She has no cervical adenopathy.  Neurological: She is  alert and oriented to person, place, and time. She has normal strength. No cranial nerve deficit or sensory deficit.  Skin: Skin is warm and dry.  Psychiatric: She has a normal mood and affect. Her speech is normal.    ED Course  Procedures (including critical care time)  Labs Reviewed - No data to display Dg Knee Complete 4 Views Left  12/09/2011  *RADIOLOGY REPORT*  Clinical Data: Left knee pain  LEFT KNEE - COMPLETE 4+ VIEW  Comparison: None.  Findings: Four views of the left knee submitted.  No acute fracture or subluxation.  Minimal narrowing of medial joint compartment.  No joint effusion.  IMPRESSION: No acute fracture or subluxation.  No joint effusion.   Original Report Authenticated By: Natasha Mead, M.D.      1. Knee pain, left       MDM  I have reviewed nursing notes, vital signs, and all appropriate lab and imaging results for this patient. X-ray of the right knee reveals minimal narrowing of the medial joint compartment. There is no effusion present no fracture or subluxation. The patient is fitted with a knee immobilizer. She is placed on diclofenac 2 times daily and advised to see the orthopedic surgeon for additional evaluation. Plan discussed with the patient in detail, films reviewed with the patient in detail. Patient is in agreement with the plan.       Kathie Dike, [place] 12/10/11 [phone]

## 2011-12-11 NOTE — ED Provider Notes (Signed)
Medical screening examination/treatment/procedure(s) were performed by non-physician practitioner and as supervising physician I was immediately available for consultation/collaboration.   Dione Booze, MD 12/11/11 [phone]

## 2012-08-14 ENCOUNTER — Other Ambulatory Visit: Payer: Self-pay | Admitting: Family Medicine

## 2012-08-14 ENCOUNTER — Telehealth: Payer: Self-pay | Admitting: Family Medicine

## 2012-08-14 ENCOUNTER — Other Ambulatory Visit (HOSPITAL_COMMUNITY)
Admission: RE | Admit: 2012-08-14 | Discharge: 2012-08-14 | Disposition: A | Discharge status: Home / Self Care | Payer: BC Managed Care – PPO | Source: Ambulatory Visit | Attending: Family Medicine | Admitting: Family Medicine

## 2012-08-14 ENCOUNTER — Ambulatory Visit (INDEPENDENT_AMBULATORY_CARE_PROVIDER_SITE_OTHER): Payer: BC Managed Care – PPO | Admitting: Family Medicine

## 2012-08-14 ENCOUNTER — Encounter: Payer: Self-pay | Admitting: Family Medicine

## 2012-08-14 VITALS — BP 118/74 | HR 71 | Resp 16 | Ht 63.0 in | Wt 203.0 lb

## 2012-08-14 DIAGNOSIS — Z01419 Encounter for gynecological examination (general) (routine) without abnormal findings: Secondary | ICD-10-CM | POA: Insufficient documentation

## 2012-08-14 DIAGNOSIS — Z113 Encounter for screening for infections with a predominantly sexual mode of transmission: Secondary | ICD-10-CM | POA: Insufficient documentation

## 2012-08-14 DIAGNOSIS — L739 Follicular disorder, unspecified: Secondary | ICD-10-CM

## 2012-08-14 DIAGNOSIS — L738 Other specified follicular disorders: Secondary | ICD-10-CM

## 2012-08-14 DIAGNOSIS — E559 Vitamin D deficiency, unspecified: Secondary | ICD-10-CM

## 2012-08-14 DIAGNOSIS — Z124 Encounter for screening for malignant neoplasm of cervix: Secondary | ICD-10-CM

## 2012-08-14 DIAGNOSIS — N6324 Unspecified lump in the left breast, lower inner quadrant: Secondary | ICD-10-CM

## 2012-08-14 DIAGNOSIS — Z1151 Encounter for screening for human papillomavirus (HPV): Secondary | ICD-10-CM | POA: Insufficient documentation

## 2012-08-14 DIAGNOSIS — R140 Abdominal distension (gaseous): Secondary | ICD-10-CM

## 2012-08-14 DIAGNOSIS — Z0000 Encounter for general adult medical examination without abnormal findings: Secondary | ICD-10-CM

## 2012-08-14 DIAGNOSIS — M25562 Pain in left knee: Secondary | ICD-10-CM

## 2012-08-14 DIAGNOSIS — R5381 Other malaise: Secondary | ICD-10-CM

## 2012-08-14 DIAGNOSIS — R141 Gas pain: Secondary | ICD-10-CM

## 2012-08-14 DIAGNOSIS — N760 Acute vaginitis: Secondary | ICD-10-CM | POA: Insufficient documentation

## 2012-08-14 DIAGNOSIS — E669 Obesity, unspecified: Secondary | ICD-10-CM

## 2012-08-14 DIAGNOSIS — M25569 Pain in unspecified knee: Secondary | ICD-10-CM

## 2012-08-14 DIAGNOSIS — K3189 Other diseases of stomach and duodenum: Secondary | ICD-10-CM

## 2012-08-14 DIAGNOSIS — N63 Unspecified lump in unspecified breast: Secondary | ICD-10-CM

## 2012-08-14 DIAGNOSIS — K219 Gastro-esophageal reflux disease without esophagitis: Secondary | ICD-10-CM

## 2012-08-14 DIAGNOSIS — R5383 Other fatigue: Secondary | ICD-10-CM

## 2012-08-14 DIAGNOSIS — Z139 Encounter for screening, unspecified: Secondary | ICD-10-CM

## 2012-08-14 DIAGNOSIS — R1013 Epigastric pain: Secondary | ICD-10-CM

## 2012-08-14 DIAGNOSIS — R143 Flatulence: Secondary | ICD-10-CM

## 2012-08-14 MED ORDER — OMEPRAZOLE 20 MG PO CPDR: 20.0000 mg | DELAYED_RELEASE_CAPSULE | Freq: Every day | ORAL | Status: DC

## 2012-08-14 MED ORDER — MUPIROCIN 2 % EX OINT: TOPICAL_OINTMENT | Freq: Two times a day (BID) | CUTANEOUS | Status: AC

## 2012-08-14 NOTE — Telephone Encounter (Signed)
That's the pharmacy entered in system for her

## 2012-08-14 NOTE — Progress Notes (Signed)
  Subjective:    Patient ID: [name], female    DOB: 1975-09-15, 37 y.o.   MRN: [id number]  HPI Pt in for annual exam Keeping well  Generally , she does have GI and musculoskeletal complaints however, also is concerned about a possible left breast lump. No f/h of breast cancer   Review of Systems See HPI Denies recent fever or chills. Denies sinus pressure, nasal congestion, ear pain or sore throat. Denies chest congestion, productive cough or wheezing. Denies chest pains, palpitations and leg swelling C/o recurrent bloating and dyspepsia , worse in the past 2 months, denies change in BM, but does have chronic constipation   Denies dysuria, frequency, hesitancy or incontinence. C/o increased knee pain and instability of left knee wants ortho input Denies headaches, seizures, numbness, or tingling. Denies depression, anxiety or insomnia. Denies skin break c/o infected hair follicles intermittently requests ointment for this        Objective:   Physical Exam  Pleasant well nourished female, alert and oriented x 3, in no cardio-pulmonary distress. Afebrile. HEENT No facial trauma or asymetry. Sinuses non tender.  EOMI, PERTL, fundoscopic exam is normal, no hemorhage or exudate.  External ears normal, tympanic membranes clear. Oropharynx moist, no exudate, good dentition. Neck: supple, no adenopathy,JVD or thyromegaly.No bruits.  Chest: Clear to ascultation bilaterally.No crackles or wheezes. Non tender to palpation  Breast: No asymetry,questrionable nodularity/mass in area of concern on left breast at 7 o clock No nipple discharge or inversion. No axillary or supraclavicular adenopathy  Cardiovascular system; Heart sounds normal,  S1 and  S2 ,no S3.  No murmur, or thrill. Apical beat not displaced Peripheral pulses normal.  Abdomen: Soft, non tender, no organomegaly or masses. No bruits. Bowel sounds normal. No guarding, tenderness or  rebound.   GU: External genitalia normal. No lesions. Vaginal canal normal.white non malodorous  discharge. Uterus normal size, no adnexal masses, no cervical motion or adnexal tenderness.  Musculoskeletal exam: Full ROM of spine, hips , shoulders and reduced in left  Knee with crepitus. No deformity ,swelling or crepitus noted. No muscle wasting or atrophy.   Neurologic: Cranial nerves 2 to 12 intact. Power, tone ,sensation and reflexes normal throughout. No disturbance in gait. No tremor.  Skin: Intact, no ulceration, erythema , scaling or rash noted. Pigmentation normal throughout  Psych; Normal mood and affect. Judgement and concentration normal       Assessment & Plan:

## 2012-08-14 NOTE — Patient Instructions (Addendum)
F/u in 4 month, call if you need me before  Flu vaccine available in October, pls call the office  To get this  Fasting CBC, lipid, chem 7, TSH, HBa1C, H pylori and Vit D as soon as possible. Results will be sent to you in "my chart"   New for reflux is omeprazole 20 mg twice daily for  2 month only.Weight loss will help a lot also   You are referred for  a diagnostic mammogram of left breast in area of concern  You are referred for an [place] of gall bladder due to symptoms discussed.  You are referred to orthopedics to evaluate left knee  Pls commit to daily physical activity for at least 30 minutes, and also healthy food choices, mainly vegetable and fruit, and portion control  Weight loss goal of 3 pounds per month  We will provide a 1500calorie diet sheet, individual counseling is also available free of charge if you want this, please let us know

## 2012-08-15 LAB — LIPID PANEL
LDL Cholesterol: 74 mg/dL (ref 0–99)
VLDL: 12 mg/dL (ref 0–40)

## 2012-08-15 LAB — CBC WITH DIFFERENTIAL/PLATELET
Basophils Absolute: 0 10*3/uL (ref 0.0–0.1)
Basophils Relative: 0 % (ref 0–1)
Eosinophils Absolute: 0.3 10*3/uL (ref 0.0–0.7)
HCT: 36.4 % (ref 36.0–46.0)
Hemoglobin: 12 g/dL (ref 12.0–15.0)
MCH: 27.3 pg (ref 26.0–34.0)
MCHC: 33 g/dL (ref 30.0–36.0)
Monocytes Absolute: 0.5 10*3/uL (ref 0.1–1.0)
Monocytes Relative: 5 % (ref 3–12)
RDW: 13.8 % (ref 11.5–15.5)

## 2012-08-15 LAB — BASIC METABOLIC PANEL
CO2: 27 mEq/L (ref 19–32)
Chloride: 102 mEq/L (ref 96–112)
Potassium: 4.5 mEq/L (ref 3.5–5.3)
Sodium: 136 mEq/L (ref 135–145)

## 2012-08-16 LAB — HEMOGLOBIN A1C
Hgb A1c MFr Bld: 5.5 % (ref <5.7)
Mean Plasma Glucose: 111 mg/dL (ref <117)

## 2012-08-16 LAB — VITAMIN D 25 HYDROXY (VIT D DEFICIENCY, FRACTURES): Vit D, 25-Hydroxy: 19 ng/mL — ABNORMAL LOW (ref 30–89)

## 2012-08-16 LAB — H. PYLORI ANTIBODY, IGG: H Pylori IgG: <0.4 {ISR}

## 2012-08-17 ENCOUNTER — Other Ambulatory Visit: Payer: Self-pay

## 2012-08-17 DIAGNOSIS — E559 Vitamin D deficiency, unspecified: Secondary | ICD-10-CM

## 2012-08-17 MED ORDER — ERGOCALCIFEROL 1.25 MG (50000 UT) PO CAPS: 50000.0000 [IU] | ORAL_CAPSULE | ORAL | Status: DC

## 2012-08-24 ENCOUNTER — Telehealth: Payer: Self-pay | Admitting: Family Medicine

## 2012-08-24 DIAGNOSIS — Z3490 Encounter for supervision of normal pregnancy, unspecified, unspecified trimester: Secondary | ICD-10-CM

## 2012-08-24 NOTE — Telephone Encounter (Signed)
Called back and left a message to call back

## 2012-08-26 NOTE — Assessment & Plan Note (Signed)
Recurrent abdominal blating and dyspesia, refer  For RUQ ultrasound

## 2012-08-26 NOTE — Assessment & Plan Note (Signed)
Refer for imaging study

## 2012-08-26 NOTE — Assessment & Plan Note (Signed)
Exam as documented. Referrals to orthopedics, for [place] of left breast and also for RUQ are entered. Pt to work on healthty lifestyle to  Facilitate weight loss, goals set and f/u planned

## 2012-08-26 NOTE — Assessment & Plan Note (Signed)
Deteriorating in past approx 10 month with instability, refer ortho

## 2012-08-26 NOTE — Assessment & Plan Note (Signed)
Deteriorated. Patient re-educated about  the importance of commitment to a  minimum of 150 minutes of exercise per week. The importance of healthy food choices with portion control discussed. Encouraged to start a food diary, count calories and to consider  joining a support group. Sample diet sheets offered. Goals set by the patient for the next several months.    

## 2012-08-27 ENCOUNTER — Telehealth: Payer: Self-pay

## 2012-08-28 ENCOUNTER — Other Ambulatory Visit: Payer: Self-pay | Admitting: Family Medicine

## 2012-08-28 DIAGNOSIS — Z3201 Encounter for pregnancy test, result positive: Secondary | ICD-10-CM

## 2012-08-28 DIAGNOSIS — R102 Pelvic and perineal pain: Secondary | ICD-10-CM

## 2012-08-28 DIAGNOSIS — O26899 Other specified pregnancy related conditions, unspecified trimester: Secondary | ICD-10-CM

## 2012-08-28 LAB — HCG, QUANTITATIVE, PREGNANCY: hCG, Beta Chain, Quant, S: 108182 m[IU]/mL

## 2012-08-28 NOTE — Telephone Encounter (Signed)
Called and left message on personal voicemail with requested information

## 2012-08-28 NOTE — Telephone Encounter (Signed)
Spoke with pt last night, c/o pelvic pain x 1 day, no spotting, pregnancy test confiermed by blood test at her OB office. States LMP was on June 20, prior to this she had gone 2 months without a period the cycle before Will order pelvic US as sta this morning Meds have been changed by her ob she reports

## 2012-08-29 ENCOUNTER — Ambulatory Visit (HOSPITAL_COMMUNITY)
Admission: RE | Admit: 2012-08-29 | Discharge: 2012-08-29 | Disposition: A | Discharge status: Home / Self Care | Payer: BC Managed Care – PPO | Source: Ambulatory Visit | Attending: Family Medicine | Admitting: Family Medicine

## 2012-08-29 ENCOUNTER — Ambulatory Visit (HOSPITAL_COMMUNITY): Admission: RE | Admit: 2012-08-29 | Payer: BC Managed Care – PPO | Source: Ambulatory Visit

## 2012-08-29 ENCOUNTER — Other Ambulatory Visit: Payer: BC Managed Care – PPO

## 2012-08-29 DIAGNOSIS — R9389 Abnormal findings on diagnostic imaging of other specified body structures: Secondary | ICD-10-CM | POA: Insufficient documentation

## 2012-08-29 DIAGNOSIS — O26899 Other specified pregnancy related conditions, unspecified trimester: Secondary | ICD-10-CM

## 2012-08-29 DIAGNOSIS — Z3201 Encounter for pregnancy test, result positive: Secondary | ICD-10-CM

## 2012-08-29 DIAGNOSIS — R109 Unspecified abdominal pain: Secondary | ICD-10-CM | POA: Insufficient documentation

## 2012-08-29 DIAGNOSIS — R102 Pelvic and perineal pain: Secondary | ICD-10-CM

## 2012-08-29 DIAGNOSIS — O99891 Other specified diseases and conditions complicating pregnancy: Secondary | ICD-10-CM | POA: Insufficient documentation

## 2012-09-06 LAB — OB RESULTS CONSOLE HEPATITIS B SURFACE ANTIGEN: Hepatitis B Surface Ag: NEGATIVE

## 2012-09-06 LAB — OB RESULTS CONSOLE RPR: RPR: NONREACTIVE

## 2012-09-06 LAB — OB RESULTS CONSOLE ABO/RH: RH Type: POSITIVE

## 2012-09-06 LAB — OB RESULTS CONSOLE HIV ANTIBODY (ROUTINE TESTING): HIV: NONREACTIVE

## 2012-09-06 LAB — OB RESULTS CONSOLE ANTIBODY SCREEN: ANTIBODY SCREEN: NEGATIVE

## 2012-09-06 LAB — OB RESULTS CONSOLE RUBELLA ANTIBODY, IGM: RUBELLA: IMMUNE

## 2012-09-12 LAB — OB RESULTS CONSOLE GC/CHLAMYDIA
CHLAMYDIA, DNA PROBE: NEGATIVE
GC PROBE AMP, GENITAL: NEGATIVE

## 2012-11-08 ENCOUNTER — Other Ambulatory Visit: Payer: Self-pay

## 2012-12-14 ENCOUNTER — Ambulatory Visit: Payer: BC Managed Care – PPO | Admitting: Family Medicine

## 2013-01-03 NOTE — L&D Delivery Note (Signed)
Delivery Note At 7:43 AM a viable and healthy female was delivered via Vaginal, Spontaneous Delivery (Presentation: ; Occiput Anterior).  APGAR: 8, 9; weight 7 lb 6.3 oz (3355 g).   Placenta status: Intact, Spontaneous Pathology.  Cord: 3 vessels with the following complications:  None Short.  Cord pH: none  Anesthesia: Epidural Local  Episiotomy: None Lacerations: 2nd degree;Perineal Suture Repair: 3.0 chromic Est. Blood Loss (mL): 300  Mom to postpartum.  Baby to Couplet care / Skin to Skin.  [name] A 04/06/2013, 8:22 AM

## 2013-03-14 LAB — OB RESULTS CONSOLE GBS: STREP GROUP B AG: NEGATIVE

## 2013-04-04 ENCOUNTER — Encounter (HOSPITAL_COMMUNITY): Payer: Self-pay | Admitting: *Deleted

## 2013-04-04 ENCOUNTER — Other Ambulatory Visit: Payer: Self-pay | Admitting: Obstetrics and Gynecology

## 2013-04-04 ENCOUNTER — Telehealth (HOSPITAL_COMMUNITY): Payer: Self-pay | Admitting: *Deleted

## 2013-04-04 NOTE — Telephone Encounter (Signed)
Preadmission screen  

## 2013-04-05 ENCOUNTER — Inpatient Hospital Stay (HOSPITAL_COMMUNITY)
Admission: AD | Admit: 2013-04-05 | Discharge: 2013-04-08 | DRG: 774 | Disposition: A | Discharge status: Home / Self Care | Payer: BC Managed Care – PPO | Source: Ambulatory Visit | Attending: Obstetrics and Gynecology | Admitting: Obstetrics and Gynecology

## 2013-04-05 ENCOUNTER — Inpatient Hospital Stay (HOSPITAL_COMMUNITY): Payer: BC Managed Care – PPO | Admitting: Anesthesiology

## 2013-04-05 ENCOUNTER — Encounter (HOSPITAL_COMMUNITY): Payer: Self-pay

## 2013-04-05 ENCOUNTER — Encounter (HOSPITAL_COMMUNITY): Payer: BC Managed Care – PPO | Admitting: Anesthesiology

## 2013-04-05 DIAGNOSIS — D62 Acute posthemorrhagic anemia: Secondary | ICD-10-CM | POA: Diagnosis not present

## 2013-04-05 DIAGNOSIS — O09519 Supervision of elderly primigravida, unspecified trimester: Secondary | ICD-10-CM | POA: Diagnosis present

## 2013-04-05 DIAGNOSIS — K5900 Constipation, unspecified: Secondary | ICD-10-CM | POA: Diagnosis present

## 2013-04-05 DIAGNOSIS — K219 Gastro-esophageal reflux disease without esophagitis: Secondary | ICD-10-CM | POA: Diagnosis present

## 2013-04-05 DIAGNOSIS — E669 Obesity, unspecified: Secondary | ICD-10-CM | POA: Diagnosis present

## 2013-04-05 DIAGNOSIS — O411090 Infection of amniotic sac and membranes, unspecified, unspecified trimester, not applicable or unspecified: Secondary | ICD-10-CM | POA: Diagnosis present

## 2013-04-05 DIAGNOSIS — O480 Post-term pregnancy: Principal | ICD-10-CM | POA: Diagnosis present

## 2013-04-05 DIAGNOSIS — O99214 Obesity complicating childbirth: Secondary | ICD-10-CM

## 2013-04-05 DIAGNOSIS — O9903 Anemia complicating the puerperium: Secondary | ICD-10-CM | POA: Diagnosis not present

## 2013-04-05 DIAGNOSIS — K589 Irritable bowel syndrome without diarrhea: Secondary | ICD-10-CM | POA: Diagnosis present

## 2013-04-05 LAB — TYPE AND SCREEN
ABO/RH(D): A POS
Antibody Screen: NEGATIVE

## 2013-04-05 LAB — CBC
HEMATOCRIT: 35.8 % — AB (ref 36.0–46.0)
HEMOGLOBIN: 12.2 g/dL (ref 12.0–15.0)
MCH: 28.2 pg (ref 26.0–34.0)
MCHC: 34.1 g/dL (ref 30.0–36.0)
MCV: 82.9 fL (ref 78.0–100.0)
Platelets: 300 10*3/uL (ref 150–400)
RBC: 4.32 MIL/uL (ref 3.87–5.11)
RDW: 14.6 % (ref 11.5–15.5)
WBC: 15.3 10*3/uL — ABNORMAL HIGH (ref 4.0–10.5)

## 2013-04-05 LAB — RPR: RPR Ser Ql: NONREACTIVE

## 2013-04-05 LAB — ABO/RH: ABO/RH(D): A POS

## 2013-04-05 MED ORDER — LACTATED RINGERS IV SOLN
500.0000 mL | INTRAVENOUS | Status: DC | PRN
  Administered 2013-04-05: 500 mL via INTRAVENOUS

## 2013-04-05 MED ORDER — FENTANYL 2.5 MCG/ML BUPIVACAINE 1/10 % EPIDURAL INFUSION (WH - ANES)
14.0000 mL/h | INTRAMUSCULAR | Status: DC | PRN
  Administered 2013-04-06: 14 mL/h via EPIDURAL
  Filled 2013-04-05 (×2): qty 125

## 2013-04-05 MED ORDER — BUTORPHANOL TARTRATE 1 MG/ML IJ SOLN
2.0000 mg | INTRAMUSCULAR | Status: DC | PRN
  Filled 2013-04-05: qty 2

## 2013-04-05 MED ORDER — LACTATED RINGERS IV SOLN
INTRAVENOUS | Status: DC
  Administered 2013-04-05 – 2013-04-06 (×2): via INTRAUTERINE

## 2013-04-05 MED ORDER — LIDOCAINE HCL (PF) 1 % IJ SOLN
30.0000 mL | INTRAMUSCULAR | Status: AC | PRN
  Administered 2013-04-06: 30 mL via SUBCUTANEOUS
  Filled 2013-04-05: qty 30

## 2013-04-05 MED ORDER — EPHEDRINE 5 MG/ML INJ: 10.0000 mg | INTRAVENOUS | Status: DC | PRN

## 2013-04-05 MED ORDER — TERBUTALINE SULFATE 1 MG/ML IJ SOLN: 0.2500 mg | Freq: Once | INTRAMUSCULAR | Status: AC | PRN

## 2013-04-05 MED ORDER — OXYTOCIN BOLUS FROM INFUSION: 500.0000 mL | INTRAVENOUS | Status: DC

## 2013-04-05 MED ORDER — OXYTOCIN 40 UNITS IN LACTATED RINGERS INFUSION - SIMPLE MED: 62.5000 mL/h | INTRAVENOUS | Status: DC

## 2013-04-05 MED ORDER — CITRIC ACID-SODIUM CITRATE 334-500 MG/5ML PO SOLN
30.0000 mL | ORAL | Status: DC | PRN
  Administered 2013-04-05: 30 mL via ORAL
  Filled 2013-04-05: qty 15

## 2013-04-05 MED ORDER — DIPHENHYDRAMINE HCL 50 MG/ML IJ SOLN: 12.5000 mg | INTRAMUSCULAR | Status: DC | PRN

## 2013-04-05 MED ORDER — EPHEDRINE 5 MG/ML INJ
10.0000 mg | INTRAVENOUS | Status: DC | PRN
  Filled 2013-04-05: qty 4

## 2013-04-05 MED ORDER — PHENYLEPHRINE 40 MCG/ML (10ML) SYRINGE FOR IV PUSH (FOR BLOOD PRESSURE SUPPORT)
80.0000 ug | PREFILLED_SYRINGE | INTRAVENOUS | Status: DC | PRN
  Filled 2013-04-05: qty 10

## 2013-04-05 MED ORDER — PHENYLEPHRINE 40 MCG/ML (10ML) SYRINGE FOR IV PUSH (FOR BLOOD PRESSURE SUPPORT): 80.0000 ug | PREFILLED_SYRINGE | INTRAVENOUS | Status: DC | PRN

## 2013-04-05 MED ORDER — LACTATED RINGERS IV SOLN
500.0000 mL | Freq: Once | INTRAVENOUS | Status: AC
  Administered 2013-04-05: 500 mL via INTRAVENOUS

## 2013-04-05 MED ORDER — OXYCODONE-ACETAMINOPHEN 5-325 MG PO TABS: 1.0000 | ORAL_TABLET | ORAL | Status: DC | PRN

## 2013-04-05 MED ORDER — FENTANYL 2.5 MCG/ML BUPIVACAINE 1/10 % EPIDURAL INFUSION (WH - ANES)
INTRAMUSCULAR | Status: DC | PRN
  Administered 2013-04-05: 14 mL/h via EPIDURAL

## 2013-04-05 MED ORDER — LIDOCAINE HCL (PF) 1 % IJ SOLN
INTRAMUSCULAR | Status: DC | PRN
  Administered 2013-04-05: 8 mL
  Administered 2013-04-05: 6 mL

## 2013-04-05 MED ORDER — IBUPROFEN 600 MG PO TABS: 600.0000 mg | ORAL_TABLET | Freq: Four times a day (QID) | ORAL | Status: DC | PRN

## 2013-04-05 MED ORDER — ACETAMINOPHEN 325 MG PO TABS: 650.0000 mg | ORAL_TABLET | ORAL | Status: DC | PRN

## 2013-04-05 MED ORDER — ONDANSETRON HCL 4 MG/2ML IJ SOLN: 4.0000 mg | Freq: Four times a day (QID) | INTRAMUSCULAR | Status: DC | PRN

## 2013-04-05 MED ORDER — OXYTOCIN 40 UNITS IN LACTATED RINGERS INFUSION - SIMPLE MED
1.0000 m[IU]/min | INTRAVENOUS | Status: DC
  Administered 2013-04-05: 2 m[IU]/min via INTRAVENOUS
  Filled 2013-04-05: qty 1000

## 2013-04-05 MED ORDER — LACTATED RINGERS IV SOLN
INTRAVENOUS | Status: DC
  Administered 2013-04-05 – 2013-04-06 (×4): via INTRAVENOUS

## 2013-04-05 MED ORDER — FLEET ENEMA 7-19 GM/118ML RE ENEM: 1.0000 | ENEMA | RECTAL | Status: DC | PRN

## 2013-04-05 NOTE — Anesthesia Procedure Notes (Signed)
Epidural Patient location during procedure: OB Start time: 04/05/2013 7:59 PM End time: 04/05/2013 8:03 PM  Staffing Anesthesiologist: Leilani AbleHATCHETT, Corlette Ciano Performed by: anesthesiologist   Preanesthetic Checklist Completed: patient identified, surgical consent, pre-op evaluation, timeout performed, IV checked, risks and benefits discussed and monitors and equipment checked  Epidural Patient position: sitting Prep: site prepped and draped and DuraPrep Patient monitoring: continuous pulse ox and blood pressure Approach: midline Location: L3-L4 Injection technique: LOR air  Needle:  Needle type: Tuohy  Needle gauge: 17 G Needle length: 9 cm and 9 Needle insertion depth: 6 cm Catheter type: closed end flexible Catheter size: 19 Gauge Catheter at skin depth: 11 cm Test dose: negative and Other  Assessment Sensory level: T10 Events: blood not aspirated, injection not painful, no injection resistance, negative IV test and no paresthesia  Additional Notes Reason for block:procedure for pain

## 2013-04-05 NOTE — H&P (Signed)
Deziya P Weston Annallington is a 38 y.o. female presenting @ 40 1/[redacted] weeks gestation in labor. Intact membrane. GBS cx neg  Maternal Medical History:  Reason for admission: Contractions.   Contractions: Onset was 6-12 hours ago.    Fetal activity: Perceived fetal activity is normal.    Prenatal complications: no prenatal complications Prenatal Complications - Diabetes: none.    OB History   Grav Para Term Preterm Abortions TAB SAB Ect Mult Living   1              Past Medical History  Diagnosis Date  . GERD (gastroesophageal reflux disease)   . Chronic constipation   . Allergy     late Spring/early Summer  . IBS (irritable bowel syndrome)   . Hx of varicella    Past Surgical History  Procedure Laterality Date  . Breast reduction surgery    . Right ankle    . Tonsillectomy  2004  . Breast surgery Bilateral 2002    reduction  . Bunnionectomy Left 2006   Family History: family history includes Cancer in her father; Diabetes in her father, maternal aunt, and paternal aunt; Hyperlipidemia in her mother; Hypertension in her paternal aunt; Hypothyroidism in her mother. Social History:  reports that she has never smoked. She has never used smokeless tobacco. She reports that she drinks alcohol. She reports that she does not use illicit drugs.   Prenatal Transfer Tool  Maternal Diabetes: No Genetic Screening: Declined Maternal Ultrasounds/Referrals: Normal Fetal Ultrasounds or other Referrals:  None Maternal Substance Abuse:  No Significant Maternal Medications:  None Significant Maternal Lab Results:  Lab values include: Group B Strep negative Other Comments:  None  ROS neg  Dilation: 4 Effacement (%): 60 Station: -3 Exam by:: Dr. Cherly Hensenousins Blood pressure 111/72, pulse 96, temperature 99.1 F (37.3 C), temperature source Oral, resp. rate 22, height 5\' 3"  (1.6 m), weight 105.144 kg (231 lb 12.8 oz), last menstrual period 06/22/2012, SpO2 99.00%. Exam Physical Exam   Constitutional: She is oriented to person, place, and time. She appears well-developed and well-nourished.  HENT:  Head: Atraumatic.  Eyes: EOM are normal.  Neck: Neck supple.  Respiratory: Breath sounds normal.  GI: Soft.  Musculoskeletal: She exhibits edema.  Neurological: She is alert and oriented to person, place, and time.  Skin: Skin is warm and dry.  Psychiatric: She has a normal mood and affect.    Prenatal labs: ABO, Rh: A/Positive/-- (09/04 0000) Antibody: Negative (09/04 0000) Rubella: Immune (09/04 0000) RPR: Nonreactive (09/04 0000)  HBsAg: Negative (09/04 0000)  HIV: Non-reactive (09/04 0000)  GBS: Negative (03/12 0000)   Assessment/Plan: Post term Active labor P) admit routine labs. Analgesic prn. IV pitocin . Amniotomy prn. Exaggerated sims position   Jalene Lacko A 04/05/2013, 4:26 PM

## 2013-04-05 NOTE — Anesthesia Preprocedure Evaluation (Signed)
Anesthesia Evaluation  Patient identified by MRN, date of birth, ID band Patient awake    Reviewed: Allergy & Precautions, H&P , NPO status , Patient's Chart, lab work & pertinent test results  Airway Mallampati: II TM Distance: >3 FB Neck ROM: full    Dental no notable dental hx.    Pulmonary neg pulmonary ROS,    Pulmonary exam normal       Cardiovascular negative cardio ROS      Neuro/Psych negative neurological ROS  negative psych ROS   GI/Hepatic Neg liver ROS, GERD-  Medicated and Controlled,  Endo/Other  negative endocrine ROSMorbid obesity  Renal/GU negative Renal ROS     Musculoskeletal   Abdominal (+) + obese,   Peds  Hematology negative hematology ROS (+)   Anesthesia Other Findings   Reproductive/Obstetrics (+) Pregnancy                           Anesthesia Physical Anesthesia Plan  ASA: III  Anesthesia Plan: Epidural   Post-op Pain Management:    Induction:   Airway Management Planned:   Additional Equipment:   Intra-op Plan:   Post-operative Plan:   Informed Consent: I have reviewed the patients History and Physical, chart, labs and discussed the procedure including the risks, benefits and alternatives for the proposed anesthesia with the patient or authorized representative who has indicated his/her understanding and acceptance.     Plan Discussed with:   Anesthesia Plan Comments:         Anesthesia Quick Evaluation

## 2013-04-05 NOTE — Progress Notes (Signed)
Notified Dr. Cherly Hensenousins of increased uterine contractions, verbal order to decrease Pitocin dose from 10 to 6.

## 2013-04-05 NOTE — MAU Note (Signed)
Patient states she is having contractions every 3-4 minutes, worse when walking. Has a mucus discharge with a little pink tinge but denies bleeding or leaking fluid. Reports good fetal movement.

## 2013-04-05 NOTE — MAU Note (Signed)
Pt states she has been having contractions increased in intensity around 7:30. Pink discharge noted.

## 2013-04-05 NOTE — Progress Notes (Signed)
Notified Dr. Cherly Hensenousins of FHR tracing, new order to stop Pitocin.

## 2013-04-05 NOTE — Progress Notes (Signed)
S: breathing with  Ctx Requesting pain med. SROM clear fluid @ 5: 15 pm  O: VE 5/80%/-2 Pitocin 10 MIU Tracing: baseline 150  (+) accel (+) variables decels Ctx q 2 mins  sono EFW 4/1 : 9lb 2 oz  IMP: Active phase Variable deceleration  P) IUPC. Amnioinfusion. Stadol per pt request. Exaggerated right sims. Recommend epidural

## 2013-04-06 ENCOUNTER — Encounter (HOSPITAL_COMMUNITY): Payer: Self-pay | Admitting: *Deleted

## 2013-04-06 LAB — CBC WITH DIFFERENTIAL/PLATELET
BASOS PCT: 0 % (ref 0–1)
Basophils Absolute: 0 10*3/uL (ref 0.0–0.1)
Eosinophils Absolute: 0 10*3/uL (ref 0.0–0.7)
Eosinophils Relative: 0 % (ref 0–5)
HEMATOCRIT: 31.4 % — AB (ref 36.0–46.0)
Hemoglobin: 10.5 g/dL — ABNORMAL LOW (ref 12.0–15.0)
LYMPHS PCT: 5 % — AB (ref 12–46)
Lymphs Abs: 0.7 10*3/uL (ref 0.7–4.0)
MCH: 27.6 pg (ref 26.0–34.0)
MCHC: 33.4 g/dL (ref 30.0–36.0)
MCV: 82.4 fL (ref 78.0–100.0)
MONO ABS: 0.6 10*3/uL (ref 0.1–1.0)
MONOS PCT: 4 % (ref 3–12)
Neutro Abs: 11.9 10*3/uL — ABNORMAL HIGH (ref 1.7–7.7)
Neutrophils Relative %: 91 % — ABNORMAL HIGH (ref 43–77)
Platelets: 273 10*3/uL (ref 150–400)
RBC: 3.81 MIL/uL — AB (ref 3.87–5.11)
RDW: 14.5 % (ref 11.5–15.5)
WBC: 13.2 10*3/uL — ABNORMAL HIGH (ref 4.0–10.5)

## 2013-04-06 MED ORDER — IBUPROFEN 600 MG PO TABS
600.0000 mg | ORAL_TABLET | Freq: Four times a day (QID) | ORAL | Status: DC
  Administered 2013-04-06 – 2013-04-08 (×8): 600 mg via ORAL
  Filled 2013-04-06 (×8): qty 1

## 2013-04-06 MED ORDER — LACTATED RINGERS IV BOLUS (SEPSIS)
1000.0000 mL | Freq: Once | INTRAVENOUS | Status: AC
  Administered 2013-04-06: 1000 mL via INTRAVENOUS

## 2013-04-06 MED ORDER — FERROUS SULFATE 325 (65 FE) MG PO TABS
325.0000 mg | ORAL_TABLET | Freq: Two times a day (BID) | ORAL | Status: DC
  Administered 2013-04-06 – 2013-04-07 (×3): 325 mg via ORAL
  Filled 2013-04-06 (×4): qty 1

## 2013-04-06 MED ORDER — ONDANSETRON HCL 4 MG/2ML IJ SOLN: 4.0000 mg | INTRAMUSCULAR | Status: DC | PRN

## 2013-04-06 MED ORDER — ONDANSETRON HCL 4 MG PO TABS: 4.0000 mg | ORAL_TABLET | ORAL | Status: DC | PRN

## 2013-04-06 MED ORDER — GENTAMICIN SULFATE 40 MG/ML IJ SOLN
160.0000 mg | Freq: Three times a day (TID) | INTRAVENOUS | Status: DC
  Filled 2013-04-06: qty 4

## 2013-04-06 MED ORDER — OXYCODONE-ACETAMINOPHEN 5-325 MG PO TABS: 1.0000 | ORAL_TABLET | ORAL | Status: DC | PRN

## 2013-04-06 MED ORDER — ACETAMINOPHEN 500 MG PO TABS
1000.0000 mg | ORAL_TABLET | Freq: Four times a day (QID) | ORAL | Status: DC | PRN
  Administered 2013-04-06 (×2): 1000 mg via ORAL
  Filled 2013-04-06 (×2): qty 2

## 2013-04-06 MED ORDER — DIBUCAINE 1 % RE OINT
1.0000 "application " | TOPICAL_OINTMENT | RECTAL | Status: DC | PRN
  Filled 2013-04-06: qty 28

## 2013-04-06 MED ORDER — SIMETHICONE 80 MG PO CHEW: 80.0000 mg | CHEWABLE_TABLET | ORAL | Status: DC | PRN

## 2013-04-06 MED ORDER — WITCH HAZEL-GLYCERIN EX PADS
1.0000 "application " | MEDICATED_PAD | CUTANEOUS | Status: DC | PRN
  Administered 2013-04-07: 1 via TOPICAL

## 2013-04-06 MED ORDER — METHYLERGONOVINE MALEATE 0.2 MG/ML IJ SOLN: 0.2000 mg | INTRAMUSCULAR | Status: DC | PRN

## 2013-04-06 MED ORDER — LANOLIN HYDROUS EX OINT: TOPICAL_OINTMENT | CUTANEOUS | Status: DC | PRN

## 2013-04-06 MED ORDER — BENZOCAINE-MENTHOL 20-0.5 % EX AERO
1.0000 "application " | INHALATION_SPRAY | CUTANEOUS | Status: DC | PRN
  Administered 2013-04-06: 1 via TOPICAL
  Filled 2013-04-06 (×2): qty 56

## 2013-04-06 MED ORDER — SODIUM CHLORIDE 0.9 % IV SOLN
2.0000 g | Freq: Four times a day (QID) | INTRAVENOUS | Status: DC
  Administered 2013-04-06 (×2): 2 g via INTRAVENOUS
  Filled 2013-04-06 (×3): qty 2000

## 2013-04-06 MED ORDER — PRENATAL MULTIVITAMIN CH
1.0000 | ORAL_TABLET | Freq: Every day | ORAL | Status: DC
  Administered 2013-04-07 – 2013-04-08 (×2): 1 via ORAL
  Filled 2013-04-06 (×3): qty 1

## 2013-04-06 MED ORDER — ZOLPIDEM TARTRATE 5 MG PO TABS
5.0000 mg | ORAL_TABLET | Freq: Every evening | ORAL | Status: DC | PRN
Start: 2013-04-06 — End: 2013-04-07

## 2013-04-06 MED ORDER — GENTAMICIN SULFATE 40 MG/ML IJ SOLN
180.0000 mg | Freq: Once | INTRAVENOUS | Status: AC
  Administered 2013-04-06: 180 mg via INTRAVENOUS
  Filled 2013-04-06: qty 4.5

## 2013-04-06 MED ORDER — SENNOSIDES-DOCUSATE SODIUM 8.6-50 MG PO TABS
2.0000 | ORAL_TABLET | ORAL | Status: DC
  Administered 2013-04-06 – 2013-04-08 (×2): 2 via ORAL
  Filled 2013-04-06 (×2): qty 2

## 2013-04-06 MED ORDER — DIPHENHYDRAMINE HCL 25 MG PO CAPS: 25.0000 mg | ORAL_CAPSULE | Freq: Four times a day (QID) | ORAL | Status: DC | PRN

## 2013-04-06 MED ORDER — METHYLERGONOVINE MALEATE 0.2 MG PO TABS: 0.2000 mg | ORAL_TABLET | ORAL | Status: DC | PRN

## 2013-04-06 NOTE — Progress Notes (Signed)
S> denies pelvic pressure  O:  A/G Epidural Pitocin 4 miu T 99 ( ax) VE 9.5 cm /100/0 amnioinfusion Tracing reviewed. Baseline 159 (+) variables had 2 min decel Ctx q 2-3 mins 160-200 MV units  CBC    Component Value Date/Time   WBC 13.2* 04/06/2013 0110   RBC 3.81* 04/06/2013 0110   HGB 10.5* 04/06/2013 0110   HCT 31.4* 04/06/2013 0110   PLT 273 04/06/2013 0110   MCV 82.4 04/06/2013 0110   MCH 27.6 04/06/2013 0110   MCHC 33.4 04/06/2013 0110   RDW 14.5 04/06/2013 0110   LYMPHSABS 0.7 04/06/2013 0110   MONOABS 0.6 04/06/2013 0110   EOSABS 0.0 04/06/2013 0110   BASOSABS 0.0 04/06/2013 0110     IMP: presumed chorioamnionitis on A/G Active phase w/ good progression Postterm Variable decel due to cord compression . Amnioinfusion ongoing P) cont pitocin. Cont antibiotics. Exaggerated right sims . Repeat fluid bolus due to low output

## 2013-04-06 NOTE — Progress Notes (Signed)
S: comfortable  O: Temp 100.3( ax) Pitocin off due to report of lates and variable decels VE 5/100/-1 off centered  IUPC  ISE  Tracing> reviewed. Baseline 150 () accels (+) variable decels Ctx now ireg spaced 2-6 mins   IMP: Maternal fever without prolonged ROM ? dehyration Postterm P) ucx, cbc w/ diff . A/G. 1L LR. Restart pitocin. Tylenol 1000 mg. Exaggerated right sims

## 2013-04-06 NOTE — Lactation Note (Signed)
This note was copied from the chart [facility] Lactation Consultation Note  Patient Name: Girl Susan PummelShelitha Stclair WUJWJ'XToday's Date: 04/06/2013 Reason for consult: Initial assessment;Breast surgery  Visited with Mom, baby 7 hrs old, and just had bath.  Baby sleeping skin to skin on Mom's chest.  Baby has fed 3 times so far, with latch score of 8 per RN.  Mom reports feeling a strong tug, no discomfort.  Mom has a history of breast reduction surgery 12 years ago.  She reports having breast changes early in pregnancy.  Mom to call when baby begins to cue to feed again.  Talked about adding some double pumping due to her surgical history.  Mom agreeable.   Brochure left in room.  Informed Mom of IP and OP Lactation services available.   To follow up when Mom calls out.  Maternal Data Formula Feeding for Exclusion: Yes Reason for exclusion: Previous breast surgery (mastectomy, reduction, or augmentation where mother is unable to produce breast milk) Infant to breast within first hour of birth: Yes Has patient been taught Hand Expression?: Yes Does the patient have breastfeeding experience prior to this delivery?: No  Feeding Feeding Type: Breast Fed Length of feed: 15 min  LATCH Score/Interventions                      Lactation Tools Discussed/Used     Consult Status Consult Status: Follow-up Date: 04/07/13 Follow-up type: In-patient    [name], Mattison Stuckey E 04/06/2013, 3:06 PM

## 2013-04-06 NOTE — Progress Notes (Signed)
ANTIBIOTIC CONSULT NOTE - INITIAL  Pharmacy Consult for gentamicin Indication: R/O chprioamniotitis  Allergies  Allergen Reactions  . Shellfish Allergy Hives and Shortness Of Breath    Patient Measurements: Height: 5\' 3"  (160 cm) Weight: 231 lb 12.8 oz (105.144 kg) IBW/kg (Calculated) : 52.4 Adjusted Body Weight: 70 kg  Vital Signs: Temp: 100.3 F (37.9 C) (04/04 0046) Temp src: Axillary (04/04 0046) BP: 95/80 mmHg (04/04 0031) Pulse Rate: 82 (04/04 0031) Intake/Output from previous day: 04/03 0701 - 04/04 0700 In: 85.2 [I.V.:85.2] Out: -  Intake/Output from this shift: Total I/O In: 49.3 [I.V.:49.3] Out: -   Labs:  Recent Labs  04/05/13 1330  WBC 15.3*  HGB 12.2  PLT 300   Estimated Creatinine Clearance: 111.7 ml/min (by C-G formula based on Cr of 0.71). No results found for this basename: VANCOTROUGH, Leodis BinetVANCOPEAK, VANCORANDOM, GENTTROUGH, GENTPEAK, GENTRANDOM, TOBRATROUGH, TOBRAPEAK, TOBRARND, AMIKACINPEAK, AMIKACINTROU, AMIKACIN,  in the last 72 hours   Microbiology: Recent Results (from the past 720 hour(s))  OB RESULTS CONSOLE GBS     Status: None   Collection Time    03/14/13 12:00 AM      Result Value Ref Range Status   GBS Negative   Final    Medical History: Past Medical History  Diagnosis Date  . GERD (gastroesophageal reflux disease)   . Chronic constipation   . Allergy     late Spring/early Summer  . IBS (irritable bowel syndrome)   . Hx of varicella     Medications:  Scheduled:  . ampicillin (OMNIPEN) IV  2 g Intravenous Q6H  . lactated ringers  1,000 mL Intravenous Once   Infusions:  . fentaNYL 2.5 mcg/ml w/bupivacaine 1/10% in NS 100ml epidural infusion (125ml total)    . lactated ringers 125 mL/hr at 04/05/13 2018  . lactated ringers 150 mL/hr at 04/05/13 2016  . oxytocin 40 units in LR 1000 mL    . oxytocin 40 units in LR 1000 mL    . oxytocin 40 units in LR 1000 mL 4 milli-units/min (04/06/13 0100)   PRN: acetaminophen,  acetaminophen, butorphanol, citric acid-sodium citrate, diphenhydrAMINE, ePHEDrine, ePHEDrine, fentaNYL 2.5 mcg/ml w/bupivacaine 1/10% in NS 100ml epidural infusion (125ml total), ibuprofen, lactated ringers, lidocaine (PF), ondansetron, oxyCODONE-acetaminophen, phenylephrine, phenylephrine, sodium phosphate  Assessment: 89yr female term pregnancy G1P0 - with sx's of chorio.  No renal function labs yet.  Assuming CrCl 115-14525ml/min for age and condition.  Goal of Therapy:  Desire gentamicin serum peak level 6-798mcg/ml and trough level <981mcg/ml  Plan:  1. Gentamicin 180mg  IV x 1, then 2. Gentamicin 160mg  IV q8h (if tx continued) 3.  If maintenance regime started, will get measured serum creatinine to confirm CrCl estimate 4.  If maintenance regimen continues >48hr, will check actual serum gentamicin levels  Susan PrestoRochette, [name] 04/06/2013,1:08 AM

## 2013-04-06 NOTE — Progress Notes (Signed)
Pt noted having a prolong decel, notified Dr. Cherly Hensenousins with no new orders at this time.

## 2013-04-06 NOTE — Progress Notes (Signed)
S: pushing for an hour   O: Temp 99 BP 110/96 VE:  Fully (+) 1 station Tracing: baseline 165 (+) small accels. Some variable decel Ctx q 2-3  IMP: complete Presumed chorio on A?G P) cont pushing

## 2013-04-07 LAB — CULTURE, OB URINE
CULTURE: NO GROWTH
Colony Count: NO GROWTH
Special Requests: NORMAL

## 2013-04-07 LAB — CBC
HCT: 27.3 % — ABNORMAL LOW (ref 36.0–46.0)
Hemoglobin: 9.2 g/dL — ABNORMAL LOW (ref 12.0–15.0)
MCH: 27.6 pg (ref 26.0–34.0)
MCHC: 33.7 g/dL (ref 30.0–36.0)
MCV: 82 fL (ref 78.0–100.0)
PLATELETS: 236 10*3/uL (ref 150–400)
RBC: 3.33 MIL/uL — ABNORMAL LOW (ref 3.87–5.11)
RDW: 14.4 % (ref 11.5–15.5)
WBC: 17.8 10*3/uL — AB (ref 4.0–10.5)

## 2013-04-07 NOTE — Anesthesia Postprocedure Evaluation (Signed)
Anesthesia Post Note  Patient: [name]  Procedure(s) Performed: * No procedures listed *  Anesthesia type: Epidural  Patient location: Mother/Baby  Post pain: Pain level controlled  Post assessment: Post-op Vital signs reviewed  Last Vitals:  Filed Vitals:   04/07/13 0624  BP: 109/61  Pulse: 71  Temp: 36.4 C  Resp: 17    Post vital signs: Reviewed  Level of consciousness:alert  Complications: No apparent anesthesia complications

## 2013-04-07 NOTE — Progress Notes (Signed)
PPD 1 SVD  S:  Reports feeling well - sore and tired              Tolerating po/ No nausea or vomiting             Bleeding is light             Pain controlled with motrin and percocet             Up ad lib / ambulatory / voiding QS  Newborn breast feeding & supplementing (hx reduction mammoplasty) / female  O:               VS: BP 109/61  Pulse 71  Temp(Src) 97.6 F (36.4 C) (Oral)  Resp 17  Ht 5\' 3"  (1.6 m)  Wt 105.144 kg (231 lb 12.8 oz)  BMI 41.07 kg/m2  SpO2 100%  LMP 06/22/2012  Breastfeeding? Unknown   LABS:              Recent Labs  04/06/13 0110 04/07/13 0530  WBC 13.2* 17.8*  HGB 10.5* 9.2*  PLT 273 236               Blood type: --/--/A POS, A POS (04/03 1330)  Rubella: Immune (09/04 0000)                     I&O: Intake/Output     04/04 0701 - 04/05 0700 04/05 0701 - 04/06 0700   I.V. (mL/kg)     Total Intake(mL/kg)     Urine (mL/kg/hr) 200 (0.1)    Blood 300 (0.1)    Total Output 500     Net -500                        Physical Exam:             Alert and oriented X3  Lungs: Clear and unlabored  Heart: regular rate and rhythm / no mumurs  Abdomen: soft, non-tender, non-distended              Fundus: firm, non-tender, Ueven  Perineum: mild edema - ice pack in place  Lochia: light  Extremities: trace edema, no calf pain or tenderness    A: PPD # 1              IDA of pregnancy compounded with ABL with delivery - hemodynamically stable             Hx reduction mammoplasty - lactational support                                                            - anticipatory guidance -should see evidence of supply within 1 week  Doing well - stable status  P: Routine post partum orders             Anticipate DC tomorrow    Marlinda MikeBAILEY, Cherril Hett CNM, MSN, [facility]ACNM 04/07/2013, 10:31 AM

## 2013-04-07 NOTE — Lactation Note (Signed)
This note was copied from the chart of [facility] Lactation Consultation Note  Patient Name: Susan Tawni PummelShelitha Kuc ZOXWR'UToday's Date: 04/07/2013 Reason for consult: Follow-up assessment Mom recently fed, baby asleep at this visit. Mom reports she is putting baby to breast with each feeding. She reports yesterday baby was latching for 15-20 minutes, today she is sustaining her latch for approx 10 minutes on average. Mom is supplementing with each feeding due to history of breast reduction. Mom has hand pump, discussed with Mom post pumping to maximize milk production. Set up DEBP for Mom to use here, she reports she is getting pump from insurance when she gets home. Encouraged to continue to BF with each feeding, supplement according to guidelines or as baby is satisfied, post pump to encourage milk production. Encouraged to schedule OP follow up for feeding assessment before d/c.   Maternal Data    Feeding Feeding Type: Formula Length of feed: 10 min  LATCH Score/Interventions                      Lactation Tools Discussed/Used Tools: Pump Breast pump type: Double-Electric Breast Pump Pump Review: Setup, frequency, and cleaning;Milk Storage Initiated by:: KG Date initiated:: 04/07/13   Consult Status Consult Status: Follow-up Date: 04/08/13 Follow-up type: In-patient    Alfred LevinsGranger, Neveah Bang Ann 04/07/2013, 11:14 PM

## 2013-04-08 MED ORDER — BENZOCAINE-MENTHOL 20-0.5 % EX AERO: 1.0000 "application " | INHALATION_SPRAY | Freq: Three times a day (TID) | CUTANEOUS | Status: DC | PRN

## 2013-04-08 MED ORDER — FERROUS SULFATE 325 (65 FE) MG PO TABS: 325.0000 mg | ORAL_TABLET | Freq: Two times a day (BID) | ORAL | Status: DC

## 2013-04-08 MED ORDER — IBUPROFEN 600 MG PO TABS: 600.0000 mg | ORAL_TABLET | Freq: Four times a day (QID) | ORAL | Status: DC

## 2013-04-08 NOTE — Discharge Summary (Signed)
Obstetric Discharge Summary  Reason for Admission: onset of labor Prenatal Procedures: none Intrapartum Procedures: spontaneous vaginal delivery, IV antibiotics  Postpartum Procedures: none Complications-Operative and Postpartum: 2nd degree perineal laceration Hemoglobin  Date Value Ref Range Status  04/07/2013 9.2* 12.0 - 15.0 g/dL Final     HCT  Date Value Ref Range Status  04/07/2013 27.3* 36.0 - 46.0 % Final    Physical Exam:  General: alert, cooperative and no distress Lochia: appropriate Uterine Fundus: firm Perineum: 2nd degree repair healing well DVT Evaluation: No evidence of DVT seen on physical exam. Negative Homan's sign. No cords or calf tenderness. No significant calf/ankle edema.  Hospital Course: Patient was admitted postdates in active labor, IUPC and amnioinfusion, maternal fever - corrected with Tylenol,  A/G . ucx sent. CBC done normal progression to complete, pushed x 1 hour, SVD by Dr. Cherly Hensenousins Uncomplicated postpartum course. Antibiotic was not continued. Placenta path c/w acute chorioamnionitis  Discharge Diagnoses: Post-date pregnancy, delivered Acute chorioamnionitis  Discharge Information: Date: 04/08/2013 Activity: pelvic rest Diet: routine Medications: PNV, Ibuprofen and Iron Condition: stable Instructions: refer to practice specific booklet Discharge to: home Follow-up Information   Follow up with Olita Takeshita A, MD. Schedule an appointment as soon as possible for a visit in 6 weeks. (Call the office, As needed)    Specialty:  Obstetrics and Gynecology   Contact information:   [address] [place] [id number] [phone]       Newborn Data: Live born female on 04[birth year]06/2013 Birth Weight: 7 lb 6.3 oz (3355 g) APGAR: 8, 9  Home with mother.  Kenard GowerAWSON, ROLITTA, M, MSN, CNM 04/08/2013, 10:26 AM

## 2013-04-08 NOTE — Lactation Note (Signed)
This note was copied from the [facility] Lactation Consultation Note  Patient Name: [name] ZOXWR'UToday's Date: 04/08/2013   Visited with Mom on day of discharge.  Baby 49 hrs old.  Mom putting baby to breast on cue, and offering bottle supplementation > 24 ml.  Mom still using manual massage, and expression prior to latching, and massage during feeding.  No colostrum visible per Mom. Talked with Mom about trying the SNS at the breast, to increase nutritive sucking as well as reducing bottle use, which would be beneficial to her milk supply. Offered to help her at next feeding.  Mom is trying to keep baby alert and sucking for > 15 minutes at each feeding.  Obtaining a DEBP today after discharge (insurance covering).  Mom would rather try the SNS when she returns for OP lactation appointment.  Appointment made for 04/11/13 at 4pm.  Engorgement prevention and treatment discussed.  To call for assistance as needed.        [name], [name] 04/08/2013, 9:03 AM

## 2013-04-08 NOTE — Discharge Instructions (Signed)
Iron-Rich Diet  An iron-rich diet contains foods that are good sources of iron. Iron is an important mineral that helps your body produce hemoglobin. Hemoglobin is a protein in red blood cells that carries oxygen to the body's tissues. Sometimes, the iron level in your blood can be low. This may be caused by:  A lack of iron in your diet.  Blood loss.  Times of growth, such as during pregnancy or during a child's growth and development. Low levels of iron can cause a decrease in the number of red blood cells. This can result in iron deficiency anemia. Iron deficiency anemia symptoms include:  Tiredness.  Weakness.  Irritability.  Increased chance of infection. Here are some recommendations for daily iron intake:  Males older than 38 years of age need 8 mg of iron per day.  Women ages 6019 to 4850 need 18 mg of iron per day.  Pregnant women need 27 mg of iron per day, and women who are over 38 years of age and breastfeeding need 9 mg of iron per day.  Women over the age of 38 need 8 mg of iron per day. SOURCES OF IRON There are 2 types of iron that are found in food: heme iron and nonheme iron. Heme iron is absorbed by the body better than nonheme iron. Heme iron is found in meat, poultry, and fish. Nonheme iron is found in grains, beans, and vegetables. Heme Iron Sources Food / Iron (mg)  Chicken liver, 3 oz (85 g)/ 10 mg  Beef liver, 3 oz (85 g)/ 5.5 mg  Oysters, 3 oz (85 g)/ 8 mg  Beef, 3 oz (85 g)/ 2 to 3 mg  Shrimp, 3 oz (85 g)/ 2.8 mg  [place], 3 oz (85 g)/ 2 mg  Chicken, 3 oz (85 g) / 1 mg  Fish (tuna, halibut), 3 oz (85 g)/ 1 mg  Pork, 3 oz (85 g)/ 0.9 mg Nonheme Iron Sources Food / Iron (mg)  Ready-to-eat breakfast cereal, iron-fortified / 3.9 to 7 mg  Tofu,  cup / 3.4 mg  Kidney beans,  cup / 2.6 mg  Baked potato with skin / 2.7 mg  Asparagus,  cup / 2.2 mg  Avocado / 2 mg  Dried peaches,  cup / 1.6 mg  Raisins,  cup / 1.5 mg  Soy milk,  1 cup / 1.5 mg  Whole-wheat bread, 1 slice / 1.2 mg  Spinach, 1 cup / 0.8 mg  Broccoli,  cup / 0.6 mg IRON ABSORPTION Certain foods can decrease the body's absorption of iron. Try to avoid these foods and beverages while eating meals with iron-containing foods:  Coffee.  Tea.  Fiber.  Soy. Foods containing vitamin C can help increase the amount of iron your body absorbs from iron sources, especially from nonheme sources. Eat foods with vitamin C along with iron-containing foods to increase your iron absorption. Foods that are high in vitamin C include many fruits and vegetables. Some good sources are:  Fresh orange juice.  Oranges.  Strawberries.  Mangoes.  Grapefruit.  Red bell peppers.  Green bell peppers.  Broccoli.  Potatoes with skin.  Tomato juice. Document Released: 08/03/2004 Document Revised: 03/14/2011 Document Reviewed: 06/10/2010 [facility] Patient Information 2014 [place], [place].  Breast Pumping Tips Pumping your breast milk is a good way to stimulate milk production and have a steady supply of breast milk for your infant. Pumping is most helpful during your infant's growth spurts, when involving dad or a family member,  or when you are away. There are several types of pumps available. They can be purchased at a baby or maternity store. You can begin pumping soon after delivery, but some experts believe that you should wait about four weeks to give your infant a bottle. In general, the more you breastfeed or pump, the more milk you will have for your infant. It is also important to take good care of yourself. This will reduce stress and help your body to create a healthy supply of milk. Your caregiver or lactation consultant can give you the information and support you need in your efforts to breastfeed your infant. PUMPING BREAST MILK  Follow the tips below for successful breast pumping. Take care of yourself.  Drink enough water or fluids to keep urine  clear or pale yellow. You may notice a thirsty feeling while breastfeeding. This is because your body needs more water to make breast milk. Keep a large water bottle handy. Make healthy drink choices such as unsweetened fruit juice, milk and water. Limit soda, coffee, and alcohol (wait 2 hours to feed or pump if you have an alcoholic drink.)  Eat a healthy, well-balanced diet rich in fruits, vegetables, and whole grains.  Exercise as recommended by your caregiver.  Get plenty of sleep. Sleep when your infant sleeps. Ask friends and family for help if you need time to nap or rest.  Do not smoke. Smoking can lower your milk supply and harm your infant. If you need help quitting, ask your caregiver for a program recommendation.  Ask your caregiver about birth control options. Birth control pills may lower your milk supply. You may be advised to use condoms or other forms of birth control. Relax and pump Stimulating your let-down reflex is the key to successful and effective pumping. This makes the milk in all parts of the breast flow more freely.   It is easier to pump breast milk (and breastfeed) while you are relaxed. Find techniques that work for you. Quiet private spaces, breast massage, soothing heat placed on the breast, music, and pictures or a tape recording of your infant may help you to relax and "let down" your milk. If you have difficulty with your let down, try smelling one of your infant's blankets or an item of clothing he or she has worn while you are pumping.  When pumping, place the special suction cup (flange) directly over the nipple. It may be uncomfortable and cause nipple damage if it is not placed properly or is the wrong size. Applying a small amount of purified or modified lanolin to your nipple and the areola may help increase your comfort level. Also, you can change the speed and suction of many electric pumps to your comfort level. Your caregiver or lactation consultant can  help you with this.  If pumping continues to be painful, or you feel you are not getting very much milk when you pump, you may need a different type of pump. A lactation consultant can help you determine if this is the case.  If you are with your infant, feed him or her on demand and try pumping after each feeding. This will boost your production, even if milk does not come out. You may not be able to pump much milk at first, but keep up the routine, and this will change.  If you are working or away from your infant for several hours, try pumping for about 15 minutes every 2 to 3 hours. Pump both  breasts at the same time if you can.  If your infant has a formula feeding, make sure you pump your milk around the same time to maintain your supply.  Begin pumping breast milk a few weeks before you return to work. This will help you develop techniques that work for you and will be able to store extra milk.  Find a source of breastfeeding information that works well for you. TIPS FOR STORING BREAST MILK  Store breast milk in a sealable sterile bag, jar, or container provided with your pumping supplies.  Store milk in small amounts close to what your infant is drinking at each feeding.  Cool pumped milk in a refrigerator or cooler. Pumped milk can last at the back of the refrigerator for 3 to 8 days.  Place cooled milk at the back of the freezer for up to 3 months.  Thaw the milk in its container or bag in warm water up to 24 hours in advance. Do not use a microwave to thaw or heat milk. Do not refreeze the milk after it has been thawed.  Breast milk is safe to drink when left at room temperature (mid 70s or colder) for 4 to 8 hours. After that, throw it away.  Milk fat can separate and look funny. The color can vary slightly from day to day. This is normal. Always shake the milk before using it to mix the fat with the more watery portion. SEEK MEDICAL CARE IF:   You are having trouble pumping  or feeding your infant.  You are concerned that you are not making enough milk.  You have nipple pain, soreness, or redness.  You have other questions or concerns related to you or your infant. Document Released: 06/09/2009 Document Revised: 03/14/2011 Document Reviewed: 06/09/2009 [facility] Patient Information 2014 [place], [place].  Nutrition for the New Mother  A new mother needs good health and nutrition so she can have energy to take care of a new baby. Whether a mother breastfeeds or formula feeds the baby, it is important to have a well-balanced diet. Foods from all the food groups should be chosen to meet the new mother's energy needs and to give her the nutrients needed for repair and healing.  A HEALTHY EATING PLAN The My Pyramid plan for Moms outlines what you should eat to help you and your baby stay healthy. The energy and amount of food you need depends on whether or not you are breastfeeding. If you are breastfeeding you will need more nutrients. If you choose not to breastfeed, your nutrition goal should be to return to a healthy weight. Limiting calories may be needed if you are not breastfeeding.  HOME CARE INSTRUCTIONS   For a personal plan based on your unique needs, see your Registered Dietitian or visit [email or web address].  Eat a variety of foods. The plan below will help guide you. The following chart has a suggested daily meal plan from the My Pyramid for Moms.  Eat a variety of fruits and vegetables.  Eat more dark green and orange vegetables and cooked dried beans.  Make half your grains whole grains. Choose whole instead of refined grains.  Choose low-fat or lean meats and poultry.  Choose low-fat or fat-free dairy products like milk, cheese, or yogurt. Fruits  Breastfeeding: 2 cups  Non-Breastfeeding: 2 cups  What Counts as a serving?  1 cup of fruit or juice.   cup dried fruit. Vegetables  Breastfeeding: 3 cups  Non-Breastfeeding: 2  cups  What Counts as a serving?  1 cup raw or cooked vegetables.  Juice or 2 cups raw leafy vegetables. Grains  Breastfeeding: 8 oz  Non-Breastfeeding: 6 oz  What Counts as a serving?  1 slice bread.  1 oz ready-to-eat cereal.   cup cooked pasta, rice, or cereal. Meat and Beans  Breastfeeding: 6  oz  Non-Breastfeeding: 5  oz  What Counts as a serving?  1 oz lean meat, poultry, or fish   cup cooked dry beans   oz nuts or 1 egg  1 tbs peanut butter Milk  Breastfeeding: 3 cups  Non-Breastfeeding: 3 cups  What Counts as a serving?  1 cup milk.  8 oz yogurt.  1  oz cheese.  2 oz processed cheese. TIPS FOR THE BREASTFEEDING MOM  Rapid weight loss is not suggested when you are breastfeeding. By simply breastfeeding, you will be able to lose the weight gained during your pregnancy. Your caregiver can keep track of your weight and tell you if your weight loss is appropriate.  Be sure to drink fluids. You may notice that you are thirstier than usual. A suggestion is to drink a glass of water or other beverage whenever you breastfeed.  Avoid alcohol as it can be passed into your breast milk.  Limit caffeine drinks to no more than 2 to 3 cups per day.  You may need to keep taking your prenatal vitamin while you are breastfeeding. Talk with your caregiver about taking a vitamin or supplement. RETURING TO A HEALTHY WEIGHT  The My Pyramid Plan for Moms will help you return to a healthy weight. It will also provide the nutrients you need.  You may need to limit "empty" calories. These include:  High fat foods like fried foods, fatty meats, fast food, butter, and mayonnaise.  High sugar foods like sodas, jelly, candy, and sweets.  Be physically active. Include 30 minutes of exercise or more each day. Choose an activity you like such as walking, swimming, biking, or aerobics. Check with your caregiver before you start to exercise. Document Released:  03/29/2007 Document Revised: 03/14/2011 Document Reviewed: 03/29/2007 [facility] Patient Information 2014 [place], [place]. Postpartum Depression and Baby Blues The postpartum period begins right after the birth of a baby. During this time, there is often a great amount of joy and excitement. It is also a time of considerable changes in the life of the parent(s). Regardless of how many times a mother gives birth, each child brings new challenges and dynamics to the family. It is not unusual to have feelings of excitement accompanied by confusing shifts in moods, emotions, and thoughts. All mothers are at risk of developing postpartum depression or the "baby blues." These mood changes can occur right after giving birth, or they may occur many months after giving birth. The baby blues or postpartum depression can be mild or severe. Additionally, postpartum depression can resolve rather quickly, or it can be a long-term condition. CAUSES Elevated hormones and their rapid decline are thought to be a main cause of postpartum depression and the baby blues. There are a number of hormones that radically change during and after pregnancy. Estrogen and progesterone usually decrease immediately after delivering your baby. The level of thyroid hormone and various cortisol steroids also rapidly drop. Other factors that play a major role in these changes include major life events and genetics.  RISK FACTORS If you have any of the following risks for the baby blues or postpartum depression, know what  symptoms to watch out for during the postpartum period. Risk factors that may increase the likelihood of getting the baby blues or postpartum depression include:  Havinga personal or family history of depression.  Having depression while being pregnant.  Having premenstrual or oral contraceptive-associated mood issues.  Having exceptional life stress.  Having marital conflict.  Lacking a social support  network.  Having a baby with special needs.  Having health problems such as diabetes. SYMPTOMS Baby blues symptoms include:  Brief fluctuations in mood, such as going from extreme happiness to sadness.  Decreased concentration.  Difficulty sleeping.  Crying spells, tearfulness.  Irritability.  Anxiety. Postpartum depression symptoms typically begin within the first month after giving birth. These symptoms include:  Difficulty sleeping or excessive sleepiness.  Marked weight loss.  Agitation.  Feelings of worthlessness.  Lack of interest in activity or food. Postpartum psychosis is a very concerning condition and can be dangerous. Fortunately, it is rare. Displaying any of the following symptoms is cause for immediate medical attention. Postpartum psychosis symptoms include:  Hallucinations and delusions.  Bizarre or disorganized behavior.  Confusion or disorientation. DIAGNOSIS  A diagnosis is made by an evaluation of your symptoms. There are no medical or lab tests that lead to a diagnosis, but there are various questionnaires that a caregiver may use to identify those with the baby blues, postpartum depression, or psychosis. Often times, a screening tool called the [place] Postnatal Depression Scale is used to diagnose depression in the postpartum period.  TREATMENT The baby blues usually goes away on its own in 1 to 2 weeks. Social support is often all that is needed. You should be encouraged to get adequate sleep and rest. Occasionally, you may be given medicines to help you sleep.  Postpartum depression requires treatment as it can last several months or longer if it is not treated. Treatment may include individual or group therapy, medicine, or both to address any social, physiological, and psychological factors that may play a role in the depression. Regular exercise, a healthy diet, rest, and social support may also be strongly recommended.  Postpartum psychosis is  more serious and needs treatment right away. Hospitalization is often needed. HOME CARE INSTRUCTIONS  Get as much rest as you can. Nap when the baby sleeps.  Exercise regularly. Some women find yoga and walking to be beneficial.  Eat a balanced and nourishing diet.  Do little things that you enjoy. Have a cup of tea, take a bubble bath, read your favorite magazine, or listen to your favorite music.  Avoid alcohol.  Ask for help with household chores, cooking, grocery shopping, or running errands as needed. Do not try to do everything.  Talk to people close to you about how you are feeling. Get support from your partner, family members, friends, or other new moms.  Try to stay positive in how you think. Think about the things you are grateful for.  Do not spend a lot of time alone.  Only take medicine as directed by your caregiver.  Keep all your postpartum appointments.  Let your caregiver know if you have any concerns. SEEK MEDICAL CARE IF: You are having a reaction or problems with your medicine. SEEK IMMEDIATE MEDICAL CARE IF:  You have suicidal feelings.  You feel you may harm the baby or someone else. Document Released: 09/24/2003 Document Revised: 03/14/2011 Document Reviewed: 10/01/2012 [facility] Patient Information 2014 [place], [place].

## 2013-04-08 NOTE — Progress Notes (Signed)
Patient ID: [name], female   DOB: 01/07/1975, 38 y.o.   MRN: [id number] Post Partum Day #2            Information for the patient's newborn:  [name], [name] [[id number]]  female  Feeding: breast and bottle  Subjective: No HA, SOB, CP, F/C, breast symptoms. Pain well-controlled with ibuprofen. Normal vaginal bleeding, no clots.      Objective:  Temp:  [98.4 F (36.9 C)-98.5 F (36.9 C)] 98.4 F (36.9 C) (04/06 0658) Pulse Rate:  [60-70] 60 (04/06 0658) Resp:  [18] 18 (04/06 0658) BP: (103-106)/(51-57) 103/57 mmHg (04/06 0658) SpO2:  [99 %] 99 % (04/06 0658)  No intake or output data in the 24 hours ending 04/08/13 0835     Recent Labs  04/06/13 0110 04/07/13 0530  WBC 13.2* 17.8*  HGB 10.5* 9.2*  HCT 31.4* 27.3*  PLT 273 236    Blood type: --/--/A POS, A POS (04/03 1330) Rubella: Immune (09/04 0000)    Physical Exam:  General: alert, cooperative, no distress and mildly obese Uterine Fundus: firm, midline, U-2 Lochia: appropriate Perineum: 2nd degree repair healing well, edema none DVT Evaluation: No evidence of DVT seen on physical exam. Negative Homan's sign. No cords or calf tenderness. Calf/Ankle trace edema is present.    Assessment/Plan: PPD # 2 / 38 y.o., G1P1001 S/P: spontaneous vaginal   Active Problems:   Postpartum care following vaginal delivery (4/4)   Normal postpartum exam  Continue current postpartum care  D/C home   LOS: 3 days   Raelyn MoraAWSON, Laquon Emel, M, MSN, CNM 04/08/2013, 8:35 AM

## 2013-04-09 ENCOUNTER — Ambulatory Visit (HOSPITAL_COMMUNITY)
Admission: RE | Admit: 2013-04-09 | Discharge: 2013-04-09 | Disposition: A | Discharge status: Home / Self Care | Payer: BC Managed Care – PPO | Source: Ambulatory Visit | Attending: Obstetrics and Gynecology | Admitting: Obstetrics and Gynecology

## 2013-04-09 NOTE — Lactation Note (Signed)
Adult Lactation Consultation Outpatient Visit Note  Patient Name: [name]                             "Kimarie" Date of Birth: 1975/04/17                                                  Birth date: 4-4, 3 days Gestational Age at Delivery: [redacted]w[redacted]d                              BW: 7-6 Type of Delivery:       Vaginal del                                   Todays weight : 7-3.1,3264                                                                                                                      Breastfeeding History: Frequency of Breastfeeding: every2-3 hours Length of Feeding: 15 mins Voids: 4 Stools: 4  brown seedy  Supplementing / Method: Bottle with 25-33 ml Pumping:  Type of Pump:Medela Pump N Style advance   Frequency:every 2-3 hours for 15 mins  Volume:  none  Comments: Mother had a Breast Reduction 12 years ago. She states that she didn't become pregnant until age 3 yrs. She has a sister that has PCOS. She remembers talking with her surgeon about breastfeeding. He stated to her that she may not be able to breastfeed.     Consultation Evaluation: Mothers breast are soft . She states she has not seen any leaking colostrum or felt any breast changes since del. Advised mother in treatment of severe engorgement  When her milk comes in. Lot of teaching on proper latch on technique, good firm support and breast compression. Mother receptive to all teaching. Mother is very determined to make as much milk as possible. Lots of support and engcouragement given .   Initial Feeding Assessment: 25 mins on (R) SNS was sat up and infant transferred 38 ml of 40 ml. Infant took SNS well. Good sustained latch with observed frequent swallowing. No transfer from mother. Pre-feed [id number] Post-feed Weight:3302 Amount Transferred:73ml Comments:  Additional Feeding Assessment:10 mins on (L) infant sustained latch for 5 mins and transferred 10 ml  Observed wide open gape with little  assistance to get infant latched. Pre-feed Weight:3302 Post-feed [id number] Amount Transferred:6 ml Comments:  Additional Feeding Assessment: Pre-feed Weight: Post-feed Weight: Amount Transferred: Comments:  Total Breast milk Transferred this Visit: 44 ml of formula with SNS, no transfer from breast.  Total Supplement Given:   Additional Interventions: Mother to continue to cue base feed at least  every 2-3 hours Offer infant at least 45 ml of formula using the SNS. If infant unable to transfer entire feeding with SNS. Supplement remainder with a bottle Advised mother to hand express before and after breastfeeding and pumping Advised mother to massage breast for 5 mins Pump for 20 mins after each feeding at least 6-8 times daily Mother to take a supplement to increase milk supply Follow up with LC in one week    Follow-Up  April13 at 1:00    Stevan BornKendrick, Addelynn Batte [facility] 04/09/2013, 1:08 PM

## 2013-04-10 ENCOUNTER — Inpatient Hospital Stay (HOSPITAL_COMMUNITY): Admission: RE | Admit: 2013-04-10 | Payer: BC Managed Care – PPO | Source: Ambulatory Visit

## 2013-04-11 ENCOUNTER — Ambulatory Visit (HOSPITAL_COMMUNITY): Payer: BC Managed Care – PPO

## 2013-04-15 ENCOUNTER — Ambulatory Visit (HOSPITAL_COMMUNITY): Payer: BC Managed Care – PPO

## 2013-04-19 ENCOUNTER — Ambulatory Visit (HOSPITAL_COMMUNITY): Payer: BC Managed Care – PPO

## 2013-06-24 ENCOUNTER — Telehealth: Payer: Self-pay | Admitting: Family Medicine

## 2013-06-24 MED ORDER — PANTOPRAZOLE SODIUM 40 MG PO TBEC: 40.0000 mg | DELAYED_RELEASE_TABLET | Freq: Every day | ORAL | Status: DC

## 2013-06-24 NOTE — Telephone Encounter (Signed)
1 month sent only. Note that needs OV

## 2013-07-12 ENCOUNTER — Other Ambulatory Visit: Payer: Self-pay

## 2013-07-12 MED ORDER — PANTOPRAZOLE SODIUM 40 MG PO TBEC: 40.0000 mg | DELAYED_RELEASE_TABLET | Freq: Every day | ORAL | Status: DC

## 2013-11-04 ENCOUNTER — Encounter (HOSPITAL_COMMUNITY): Payer: Self-pay | Admitting: *Deleted

## 2013-11-19 ENCOUNTER — Ambulatory Visit (INDEPENDENT_AMBULATORY_CARE_PROVIDER_SITE_OTHER): Payer: BC Managed Care – PPO | Admitting: Family Medicine

## 2013-11-19 ENCOUNTER — Ambulatory Visit (INDEPENDENT_AMBULATORY_CARE_PROVIDER_SITE_OTHER): Payer: BC Managed Care – PPO

## 2013-11-19 ENCOUNTER — Encounter: Payer: Self-pay | Admitting: Family Medicine

## 2013-11-19 VITALS — BP 108/74 | HR 80 | Resp 18 | Ht 63.0 in | Wt 219.1 lb

## 2013-11-19 DIAGNOSIS — Z23 Encounter for immunization: Secondary | ICD-10-CM

## 2013-11-19 DIAGNOSIS — K219 Gastro-esophageal reflux disease without esophagitis: Secondary | ICD-10-CM

## 2013-11-19 DIAGNOSIS — L309 Dermatitis, unspecified: Secondary | ICD-10-CM

## 2013-11-19 DIAGNOSIS — R140 Abdominal distension (gaseous): Secondary | ICD-10-CM

## 2013-11-19 DIAGNOSIS — E559 Vitamin D deficiency, unspecified: Secondary | ICD-10-CM

## 2013-11-19 DIAGNOSIS — E669 Obesity, unspecified: Secondary | ICD-10-CM

## 2013-11-19 DIAGNOSIS — R5383 Other fatigue: Secondary | ICD-10-CM

## 2013-11-19 DIAGNOSIS — L739 Follicular disorder, unspecified: Secondary | ICD-10-CM

## 2013-11-19 MED ORDER — PANTOPRAZOLE SODIUM 40 MG PO TBEC: 40.0000 mg | DELAYED_RELEASE_TABLET | Freq: Every day | ORAL | Status: DC

## 2013-11-19 MED ORDER — BETAMETHASONE DIPROPIONATE 0.05 % EX CREA: TOPICAL_CREAM | CUTANEOUS | Status: DC

## 2013-11-19 NOTE — Patient Instructions (Addendum)
F/u in 4.5 months, call if you need me before  Steroid cream prescribed for rash which looks like eczema and you are also referred to dermatology   Change from shaving to cutting hair under arms  Continue dedicaction to healthy diet and regular exercise, weight loss of max 2 pounds per week is considered healthy  Reflux meds are sent in   CBC, iron and ferritin, lipid, chem 7 , TSH and Vit D today, and H pylori  Flu vaccine today

## 2013-11-19 NOTE — Progress Notes (Signed)
   Subjective:    Patient ID: [name], female    DOB: 03/29/1975, 38 y.o.   MRN: [id number]  HPI  Generalized dry itchy skin x  6 weeks, no change in her personal products or detergents, however city water changed and pt has concerns Re establishing care following delivcery of her first child  C/o increased and uncontrolled reflux and heartburn symptoms, requests medication to help with this Working at [employer] wo[employer] lifestyle change and reports good success to date   Review of Systems See HPI Denies recent fever or chills. Denies sinus pressure, nasal congestion, ear pain or sore throat. Denies chest congestion, productive cough or wheezing. Denies chest pains, palpitations and leg swelling Denies nausea, vomiting,diarrhea or constipation.   Denies dysuria, frequency, hesitancy or incontinence. Denies joint pain, swelling and limitation in mobility. Denies headaches, seizures, numbness, or tingling. Denies depression, anxiety or insomnia.       Objective:   Physical Exam   BP 108/74 mmHg  Pulse 80  Resp 18  Ht 5\' 3"  (1.6 m)  Wt 219 lb 1.9 oz (99.392 kg)  BMI 38.83 kg/m2  SpO2 97%  Patient alert and oriented and in no cardiopulmonary distress.  HEENT: No facial asymmetry, EOMI,   oropharynx pink and moist.  Neck supple no JVD, no mass.  Chest: Clear to auscultation bilaterally.  CVS: S1, S2 no murmurs, no S3.Regular rate.  ABD: Soft non tender.   Ext: No edema  MS: Adequate ROM spine, shoulders, hips and knees.  Skin: Intact, eczematous rash  Present, also folliculitis in axillae  Psych: Good eye contact, normal affect. Memory intact not anxious or depressed appearing.  CNS: CN 2-12 intact, power,  normal throughout.no focal deficits noted.        Assessment & Plan:

## 2013-12-29 ENCOUNTER — Emergency Department (HOSPITAL_COMMUNITY)
Admission: EM | Admit: 2013-12-29 | Discharge: 2013-12-29 | Disposition: A | Discharge status: Home / Self Care | Payer: BC Managed Care – PPO | Attending: Emergency Medicine | Admitting: Emergency Medicine

## 2013-12-29 ENCOUNTER — Encounter (HOSPITAL_COMMUNITY): Payer: Self-pay | Admitting: Emergency Medicine

## 2013-12-29 DIAGNOSIS — R51 Headache: Secondary | ICD-10-CM | POA: Insufficient documentation

## 2013-12-29 DIAGNOSIS — Z7952 Long term (current) use of systemic steroids: Secondary | ICD-10-CM | POA: Diagnosis not present

## 2013-12-29 DIAGNOSIS — Z8619 Personal history of other infectious and parasitic diseases: Secondary | ICD-10-CM | POA: Insufficient documentation

## 2013-12-29 DIAGNOSIS — H109 Unspecified conjunctivitis: Secondary | ICD-10-CM | POA: Insufficient documentation

## 2013-12-29 DIAGNOSIS — R0981 Nasal congestion: Secondary | ICD-10-CM | POA: Insufficient documentation

## 2013-12-29 DIAGNOSIS — Z79899 Other long term (current) drug therapy: Secondary | ICD-10-CM | POA: Insufficient documentation

## 2013-12-29 DIAGNOSIS — Z792 Long term (current) use of antibiotics: Secondary | ICD-10-CM | POA: Insufficient documentation

## 2013-12-29 DIAGNOSIS — H578 Other specified disorders of eye and adnexa: Secondary | ICD-10-CM | POA: Diagnosis present

## 2013-12-29 DIAGNOSIS — K219 Gastro-esophageal reflux disease without esophagitis: Secondary | ICD-10-CM | POA: Insufficient documentation

## 2013-12-29 MED ORDER — TOBRAMYCIN 0.3 % OP SOLN
2.0000 [drp] | Freq: Once | OPHTHALMIC | Status: DC
  Filled 2013-12-29: qty 5

## 2013-12-29 NOTE — ED Notes (Signed)
PT c/o redness/drainage and painful right eye x2 days with daughter recently diagnosed with "pink eye." PT denies any visual changes.

## 2013-12-29 NOTE — Discharge Instructions (Signed)
Please wash hands frequently. Change pillow case daily. Use cool compresses. Use 2 tobramycin eye drops  Every 4 hours for 5 days. See the eye specialist listed above or return to the ED if not improving. Conjunctivitis Conjunctivitis is commonly called "pink eye." Conjunctivitis can be caused by bacterial or viral infection, allergies, or injuries. There is usually redness of the lining of the eye, itching, discomfort, and sometimes discharge. There may be deposits of matter along the eyelids. A viral infection usually causes a watery discharge, while a bacterial infection causes a yellowish, thick discharge. Pink eye is very contagious and spreads by direct contact. You may be given antibiotic eyedrops as part of your treatment. Before using your eye medicine, remove all drainage from the eye by washing gently with warm water and cotton balls. Continue to use the medication until you have awakened 2 mornings in a row without discharge from the eye. Do not rub your eye. This increases the irritation and helps spread infection. Use separate towels from other household members. Wash your hands with soap and water before and after touching your eyes. Use cold compresses to reduce pain and sunglasses to relieve irritation from light. Do not wear contact lenses or wear eye makeup until the infection is gone. SEEK MEDICAL CARE IF:   Your symptoms are not better after 3 days of treatment.  You have increased pain or trouble seeing.  The outer eyelids become very red or swollen. Document Released: 01/28/2004 Document Revised: 03/14/2011 Document Reviewed: 12/20/2004 [facility] Patient Information 2015 [employer] [place]. This information is not intended to replace advice given to you by your health care provider. Make sure you discuss any questions you have with your health care provider.

## 2014-01-02 ENCOUNTER — Other Ambulatory Visit: Payer: Self-pay

## 2014-01-02 ENCOUNTER — Telehealth: Payer: Self-pay

## 2014-01-02 MED ORDER — FLUCONAZOLE 150 MG PO TABS: ORAL_TABLET | ORAL | Status: DC

## 2014-01-02 MED ORDER — AZITHROMYCIN 250 MG PO TABS: ORAL_TABLET | ORAL | Status: DC

## 2014-01-02 NOTE — Telephone Encounter (Signed)
Nasal drainage started Tuesday clear (was advised sudafed) but yesterday was yellow and now brown. It keeps draining down her throat and she is coughing it up as well. Wants something sent to [facility] (also needs diflucan) Please advise

## 2014-01-02 NOTE — Telephone Encounter (Signed)
meds are entered pls send and let her know

## 2014-01-02 NOTE — Telephone Encounter (Signed)
Pt aware and meds sent  

## 2014-01-03 NOTE — ED Provider Notes (Signed)
CSN: [id number]     Arrival date & time 12/29/13  1313 History   First MD Initiated Contact with Patient 12/29/13 1605     Chief Complaint  Patient presents with  . Conjunctivitis     (Consider location/radiation/quality/duration/timing/severity/associated sxs/prior Treatment) Patient is a 39 y.o. Jackson presenting with conjunctivitis. The history is provided by the patient.  Conjunctivitis This is a new problem. The current episode started yesterday. The problem occurs intermittently. The problem has been gradually worsening. Associated symptoms include congestion and headaches. Pertinent negatives include no abdominal pain, arthralgias, chest pain, chills, coughing, fever, nausea, neck pain or vomiting. Exacerbated by: bright lights. She has tried nothing for the symptoms. The treatment provided no relief.    Past Medical History  Diagnosis Date  . GERD (gastroesophageal reflux disease)   . Chronic constipation   . Allergy     late Spring/early Summer  . IBS (irritable bowel syndrome)   . Hx of varicella    Past Surgical History  Procedure Laterality Date  . Breast reduction surgery    . Right ankle    . Tonsillectomy  2004  . Breast surgery Bilateral 2002    reduction  . Bunnionectomy Left 2006   Family History  Problem Relation Age of Onset  . Hypothyroidism Mother   . Hyperlipidemia Mother   . Cancer Father     lung  . Diabetes Father   . Hypertension Paternal Aunt   . Diabetes Paternal Aunt   . Diabetes Maternal Aunt    History  Substance Use Topics  . Smoking status: Never Smoker   . Smokeless tobacco: Never Used  . Alcohol Use: Yes     Comment: occ   OB History    Gravida Para Term Preterm AB TAB SAB Ectopic Multiple Living   Review of Systems  Constitutional: Negative for fever, chills and activity change.       All ROS Neg except as noted in HPI  HENT: Positive for congestion. Negative for nosebleeds.   Eyes: Positive for  photophobia, discharge, redness and itching.  Respiratory: Negative for cough, shortness of breath and wheezing.   Cardiovascular: Negative for chest pain and palpitations.  Gastrointestinal: Negative for nausea, vomiting, abdominal pain and blood in stool.  Genitourinary: Negative for dysuria, frequency and hematuria.  Musculoskeletal: Negative for back pain, arthralgias and neck pain.  Skin: Negative.   Neurological: Positive for headaches. Negative for dizziness, seizures and speech difficulty.  Psychiatric/Behavioral: Negative for hallucinations and confusion.      Allergies  Shellfish allergy  Home Medications   Prior to Admission medications   Medication Sig Start Date End Date Taking? Authorizing Provider  cholecalciferol (VITAMIN D) 1000 UNITS tablet Take 1,000 Units by mouth daily.   Yes Historical Provider, MD  Multiple Vitamin (MULTIVITAMIN) tablet Take 1 tablet by mouth daily.   Yes Historical Provider, MD  pantoprazole (PROTONIX) 40 MG tablet Take 1 tablet (40 mg total) by mouth daily. 11/19/13  Yes Kerri Perches, MD  polyethylene glycol powder (GLYCOLAX/MIRALAX) powder Take 17 g by mouth daily.   Yes Historical Provider, MD  azithromycin (ZITHROMAX) 250 MG tablet Two tablets on day one, then one tablet once daily for an additional 4 days 01/02/14   Kerri Perches, MD  betamethasone dipropionate (DIPROLENE) 0.05 % cream Apply sparingly twice daily to rash for 7 days then as needed Patient not taking: Reported on 12/29/2013 11/19/13  Kerri Perches, MD  fluconazole (DIFLUCAN) 150 MG tablet One tablet once daily, as needed, for vaginal; itch 01/02/14   Kerri Perches, MD   BP 122/96 mmHg  Pulse 81  Temp(Src) 98.7 F (37.1 C) (Oral)  Resp 22  Ht  (1.6 m)  Wt 218 lb (98.884 kg)  BMI 38.63 kg/m2  SpO2 100%  LMP 11/12/2013 Physical Exam  Constitutional: She is oriented to person, place, and time. She appears well-developed and well-nourished.   Non-toxic appearance.  HENT:  Head: Normocephalic.  Right Ear: Tympanic membrane and external ear normal.  Left Ear: Tympanic membrane and external ear normal.  Eyes: EOM are normal. Pupils are equal, round, and reactive to light. Right eye exhibits discharge. No scleral icterus.  Neck: Normal range of motion. Neck supple. Carotid bruit is not present.  Cardiovascular: Normal rate, regular rhythm, normal heart sounds, intact distal pulses and normal pulses.   Pulmonary/Chest: Breath sounds normal. No respiratory distress.  Abdominal: Soft. Bowel sounds are normal. There is no tenderness. There is no guarding.  Musculoskeletal: Normal range of motion.  Lymphadenopathy:       Head (right side): No submandibular adenopathy present.       Head (left side): No submandibular adenopathy present.    She has no cervical adenopathy.  Neurological: She is alert and oriented to person, place, and time. She has normal strength. No cranial nerve deficit or sensory deficit.  Skin: Skin is warm and dry.  Psychiatric: She has a normal mood and affect. Her speech is normal.  Nursing note and vitals reviewed.   ED Course  Procedures (including critical care time) Labs Review Labs Reviewed - No data to display  Imaging Review No results found.   EKG Interpretation None      MDM  Vital signs wnl. Exam is consistent with conjunctivitis. Pt to use cool compresses and tobramycin eye drops.Pt to  F/U with eye specialist if not improving.   Final diagnoses:  Conjunctivitis, right eye    **I have reviewed nursing notes, vital signs, and all appropriate lab and imaging results for this patient.Kathie Dike, PA-C 01/03/14 1609  Geoffery Lyons, MD 01/05/14 [phone]

## 2014-01-05 DIAGNOSIS — Z23 Encounter for immunization: Secondary | ICD-10-CM | POA: Insufficient documentation

## 2014-01-05 NOTE — Assessment & Plan Note (Signed)
increased and uncontrolled symptoms, pt ed done and she will start PPI

## 2014-01-05 NOTE — Assessment & Plan Note (Signed)
Vaccine administered at visit.  

## 2014-01-05 NOTE — Assessment & Plan Note (Signed)
Topical steroid prescribed for sparing use twice daily for 5 to 7 days, also referred to derm based on concerns tat city water is responsible for current skin condition

## 2014-01-05 NOTE — Assessment & Plan Note (Signed)
advised to clip axillary hair rather than shave to reduce risk of infection

## 2014-01-05 NOTE — Assessment & Plan Note (Signed)
Deteriorated. Patient re-educated about  the importance of commitment to a  minimum of 150 minutes of exercise per week. The importance of healthy food choices with portion control discussed. Encouraged to start a food diary, count calories and to consider  joining a support group. Sample diet sheets offered. Goals set by the patient for the next several months.    

## 2014-02-19 ENCOUNTER — Ambulatory Visit: Payer: Self-pay | Admitting: Family Medicine

## 2014-02-25 ENCOUNTER — Ambulatory Visit (INDEPENDENT_AMBULATORY_CARE_PROVIDER_SITE_OTHER): Payer: BLUE CROSS/BLUE SHIELD

## 2014-02-25 ENCOUNTER — Encounter: Payer: Self-pay | Admitting: Orthopedic Surgery

## 2014-02-25 ENCOUNTER — Ambulatory Visit (INDEPENDENT_AMBULATORY_CARE_PROVIDER_SITE_OTHER): Payer: BLUE CROSS/BLUE SHIELD | Admitting: Orthopedic Surgery

## 2014-02-25 VITALS — BP 116/69 | Ht 64.0 in | Wt 225.0 lb

## 2014-02-25 DIAGNOSIS — M1712 Unilateral primary osteoarthritis, left knee: Secondary | ICD-10-CM

## 2014-02-25 DIAGNOSIS — M25562 Pain in left knee: Secondary | ICD-10-CM

## 2014-02-25 NOTE — Progress Notes (Signed)
Patient ID: [name], female   DOB: [birth year], 39 y.o.   MRN: [id number]  Chief Complaint  Patient presents with  . Knee Pain    left knee pain and swelling, no known injury    HPI [name] is a 39 y.o. female.  Who presents with atraumatic onset of pain in her left knee 2 weeks. She reports diffuse knee pain medial lateral posterior and anterior and has discovered on not over the anterolateral aspect of the knee with medial joint line tenderness. History of previous knee pain evaluated at the hospital with x-ray in 2013 and was told she had bone to bone changes in the knee. No previous treatment at this point. No catching locking or giving way. No NSAID use at this point. She does have irritable bowel syndrome.  Review of Systems Review of Systems 1. constipation 2. allergy   Past Medical History  Diagnosis Date  . GERD (gastroesophageal reflux disease)   . Chronic constipation   . Allergy     late Spring/early Summer  . IBS (irritable bowel syndrome)   . Hx of varicella     Past Surgical History  Procedure Laterality Date  . Breast reduction surgery    . Right ankle    . Tonsillectomy  2004  . Breast surgery Bilateral 2002    reduction  . Bunnionectomy Left 2006    Social History History  Substance Use Topics  . Smoking status: Never Smoker   . Smokeless tobacco: Never Used  . Alcohol Use: Yes     Comment: occ    Allergies  Allergen Reactions  . Shellfish Allergy Hives and Shortness Of Breath    Current Outpatient Prescriptions  Medication Sig Dispense Refill  . azithromycin (ZITHROMAX) 250 MG tablet Two tablets on day one, then one tablet once daily for an additional 4 days 6 tablet 0  . betamethasone dipropionate (DIPROLENE) 0.05 % cream Apply sparingly twice daily to rash for 7 days then as needed (Patient not taking: Reported on 12/29/2013) 45 g 0  . cholecalciferol (VITAMIN D) 1000 UNITS tablet Take 1,000 Units by mouth daily.    .  fluconazole (DIFLUCAN) 150 MG tablet One tablet once daily, as needed, for vaginal; itch 2 tablet 0  . Multiple Vitamin (MULTIVITAMIN) tablet Take 1 tablet by mouth daily.    . pantoprazole (PROTONIX) 40 MG tablet Take 1 tablet (40 mg total) by mouth daily. 30 tablet 3  . polyethylene glycol powder (GLYCOLAX/MIRALAX) powder Take 17 g by mouth daily.     No current facility-administered medications for this visit.      Physical Exam Physical Exam Blood pressure 116/69, unknown if currently breastfeeding.  Gen. appearance normal. Body mass index is 38.6 kg/(m^2).  The patient is alert and oriented person place and time Mood is normal affect is normal Ambulatory status normal without assistive device  Exam of the right knee We find no swelling, full range of motion, all in stable. Muscle tone normal. Skin normal. Distal pulses intact sensation normal. Exam of the left knee Inspection we find small to moderate size joint effusion full range of motion. All ligaments are stable muscle tone and strength are normal. Skin is intact without rash or lesion or ulceration distal pulses and sensation remains normal  Data Reviewed Imaging shows joint space narrowing medial compartment consistent with arthritis  Assessment    Arthritis left knee      Plan    Continue ibuprofen, I would also like  her to modify her activities no impact sports or exercises  I attempted aspiration of the knee did not get any fluid back but injected anyway with cortisone         Fuller CanadaStanley Chessie Neuharth 02/25/2014, 10:54 AM    Procedure note left knee injection verbal consent was obtained to inject left knee joint  Timeout was completed to confirm the site of injection  The medications used were 40 mg of Depo-Medrol and 1% lidocaine 3 cc  Anesthesia was provided by ethyl chloride and the skin was prepped with alcohol.  After cleaning the skin with alcohol a 20-gauge needle was used to inject the left  knee joint. There were no complications. A sterile bandage was applied.

## 2014-05-21 ENCOUNTER — Telehealth: Payer: Self-pay

## 2014-05-21 DIAGNOSIS — Z13220 Encounter for screening for lipoid disorders: Secondary | ICD-10-CM

## 2014-05-21 DIAGNOSIS — E669 Obesity, unspecified: Secondary | ICD-10-CM

## 2014-05-21 NOTE — Telephone Encounter (Signed)
-----   Message from Kerri PerchesMargaret E Simpson, MD sent at 05/21/2014 12:11 AM EDT ----- Regarding: pls contact her  Needs cbc, fasting chem 7 and lipids (last CBC was abn)

## 2014-05-21 NOTE — Telephone Encounter (Signed)
Labs ordered.

## 2014-06-04 ENCOUNTER — Ambulatory Visit: Payer: BLUE CROSS/BLUE SHIELD | Admitting: Family Medicine

## 2014-06-05 ENCOUNTER — Ambulatory Visit (INDEPENDENT_AMBULATORY_CARE_PROVIDER_SITE_OTHER): Payer: BLUE CROSS/BLUE SHIELD | Admitting: Family Medicine

## 2014-06-05 ENCOUNTER — Encounter: Payer: Self-pay | Admitting: Family Medicine

## 2014-06-05 VITALS — BP 118/82 | HR 62 | Temp 99.3°F | Resp 16 | Ht 64.0 in | Wt 219.0 lb

## 2014-06-05 DIAGNOSIS — R7309 Other abnormal glucose: Secondary | ICD-10-CM | POA: Diagnosis not present

## 2014-06-05 DIAGNOSIS — J0100 Acute maxillary sinusitis, unspecified: Secondary | ICD-10-CM | POA: Insufficient documentation

## 2014-06-05 DIAGNOSIS — R7303 Prediabetes: Secondary | ICD-10-CM

## 2014-06-05 DIAGNOSIS — J018 Other acute sinusitis: Secondary | ICD-10-CM | POA: Diagnosis not present

## 2014-06-05 DIAGNOSIS — E559 Vitamin D deficiency, unspecified: Secondary | ICD-10-CM | POA: Diagnosis not present

## 2014-06-05 DIAGNOSIS — J029 Acute pharyngitis, unspecified: Secondary | ICD-10-CM | POA: Diagnosis not present

## 2014-06-05 DIAGNOSIS — D539 Nutritional anemia, unspecified: Secondary | ICD-10-CM

## 2014-06-05 DIAGNOSIS — J320 Chronic maxillary sinusitis: Secondary | ICD-10-CM | POA: Insufficient documentation

## 2014-06-05 DIAGNOSIS — J209 Acute bronchitis, unspecified: Secondary | ICD-10-CM | POA: Insufficient documentation

## 2014-06-05 LAB — HEMOGLOBIN A1C
HEMOGLOBIN A1C: 5.9 % — AB (ref <5.7)
Mean Plasma Glucose: 123 mg/dL — ABNORMAL HIGH (ref <117)

## 2014-06-05 LAB — COMPREHENSIVE METABOLIC PANEL
ALK PHOS: 69 U/L (ref 39–117)
ALT: 8 U/L (ref 0–35)
AST: 14 U/L (ref 0–37)
Albumin: 3.9 g/dL (ref 3.5–5.2)
BUN: 9 mg/dL (ref 6–23)
CHLORIDE: 102 meq/L (ref 96–112)
CO2: 28 mEq/L (ref 19–32)
CREATININE: 0.66 mg/dL (ref 0.50–1.10)
Calcium: 9.4 mg/dL (ref 8.4–10.5)
GLUCOSE: 86 mg/dL (ref 70–99)
POTASSIUM: 4.6 meq/L (ref 3.5–5.3)
Sodium: 139 mEq/L (ref 135–145)
Total Bilirubin: 0.4 mg/dL (ref 0.2–1.2)
Total Protein: 7.3 g/dL (ref 6.0–8.3)

## 2014-06-05 LAB — LIPID PANEL
CHOL/HDL RATIO: 3.1 ratio
Cholesterol: 160 mg/dL (ref 0–200)
HDL: 52 mg/dL (ref >=46)
LDL Cholesterol: 92 mg/dL (ref 0–99)
Triglycerides: 78 mg/dL (ref <150)
VLDL: 16 mg/dL (ref 0–40)

## 2014-06-05 LAB — TSH: TSH: 1.703 u[IU]/mL (ref 0.350–4.500)

## 2014-06-05 LAB — FOLATE: FOLATE: 11 ng/mL

## 2014-06-05 LAB — IRON AND TIBC
%SAT: 21 % (ref 20–55)
Iron: 69 ug/dL (ref 42–145)
TIBC: 329 ug/dL (ref 250–470)
UIBC: 260 ug/dL (ref 125–400)

## 2014-06-05 LAB — POCT RAPID STREP A (OFFICE): Rapid Strep A Screen: NEGATIVE

## 2014-06-05 LAB — FERRITIN: Ferritin: 41 ng/mL (ref 10–291)

## 2014-06-05 LAB — VITAMIN B12: VITAMIN B 12: 380 pg/mL (ref 211–911)

## 2014-06-05 MED ORDER — FLUCONAZOLE 150 MG PO TABS: 150.0000 mg | ORAL_TABLET | Freq: Once | ORAL | Status: DC

## 2014-06-05 MED ORDER — PENICILLIN V POTASSIUM 500 MG PO TABS: 500.0000 mg | ORAL_TABLET | Freq: Three times a day (TID) | ORAL | Status: DC

## 2014-06-05 NOTE — Assessment & Plan Note (Signed)
Improved sligghtly, will become more diligent with lifestyle change, as she needs to Patient re-educated about  the importance of commitment to a  minimum of 150 minutes of exercise per week.  The importance of healthy food choices with portion control discussed. Encouraged to start a food diary, count calories and to consider  joining a support group. Sample diet sheets offered. Goals set by the patient for the next several months.   Weight /BMI 06/05/2014 02/25/2014 12/29/2013  WEIGHT 219 lb 225 lb 218 lb  HEIGHT 5\' 4"  5\' 4"  5\' 3"   BMI 37.57 kg/m2 38.6 kg/m2 38.63 kg/m2    Current exercise per week 90 minutes.

## 2014-06-05 NOTE — Assessment & Plan Note (Signed)
Antibiotic , and fluconazole as needed prescribed, pt to us decongestant and expectorant she already has. Rest and increased fluids,  Work excuse x 1 day

## 2014-06-05 NOTE — Assessment & Plan Note (Signed)
Antibiotic and decongestant  For 10 days

## 2014-06-05 NOTE — Progress Notes (Signed)
   Subjective:    Patient ID: [name], female    DOB: 08/25/1975, 39 y.o.   MRN: [id number]  HPI  1 week h/o worsening head and chest congestion, associated with fever and chills intermittently. Nasal drainage has thickened , and is yellowish green, and at times bloody. Sputum is thick and yellow. C/o bilateral ear pressure, denies hearing loss and sore throat. Increasing fatigue , poor appetitie and sleep disturbed by cough. No improvement with OTC medication. Positive strep exposure  Wants help with weight loss, having problems with this, after hearing potential s/e of phentermine will see nutritionist , follow diet plan and commit to exercise  Review of Systems See HPI  Denies chest pains, palpitations and leg swelling Denies abdominal pain, nausea, vomiting,diarrhea or constipation.   Denies dysuria, frequency, hesitancy or incontinence. Denies joint pain, swelling and limitation in mobility. Denies headaches, seizures, numbness, or tingling. Denies depression, anxiety or insomnia. Denies skin break down or rash.        Objective:   Physical Exam BP 118/82 mmHg  Pulse 62  Temp(Src) 99.3 F (37.4 C)  Resp 16  Ht 5\' 4"  (1.626 m)  Wt 219 lb (99.338 kg)  BMI 37.57 kg/m2  SpO2 99%   Patient alert and oriented and in no cardiopulmonary distress.  HEENT: No facial asymmetry, EOMI,   oropharynx erythematous  and moist.  Neck supple no JVD, bilateral anterior cervical adenitis, left worse than right bilateral conjunctival injection, maxillary and ethmoid sinus tenderness, TM clear bilaterally.  Chest: Adequate air entry, few crackles , no wheezes  CVS: S1, S2 no murmurs, no S3.Regular rate.  ABD: Soft non tender.   Ext: No edema  MS: Adequate ROM spine, shoulders, hips and knees.  Skin: Intact, no ulcerations or rash noted.  Psych: Good eye contact, normal affect. Memory intact not anxious or depressed appearing.  CNS: CN 2-12 intact, power,  normal  throughout.no focal deficits noted.        Assessment & Plan:  Acute sinusitis Antibiotic , and fluconazole as needed prescribed, pt to us decongestant and expectorant she already has. Rest and increased fluids,  Work excuse x 1 day   Acute pharyngitis Symptomatic with positive strep exposure, rapid strep test is negative, will treat presumptively, also work excuse x 1 day   Acute bronchitis Antibiotic and decongestant  For 10 days   Morbid obesity Improved sligghtly, will become more diligent with lifestyle change, as she needs to Patient re-educated about  the importance of commitment to a  minimum of 150 minutes of exercise per week.  The importance of healthy food choices with portion control discussed. Encouraged to start a food diary, count calories and to consider  joining a support group. Sample diet sheets offered. Goals set by the patient for the next several months.   Weight /BMI 06/05/2014 02/25/2014 12/29/2013  WEIGHT 219 lb 225 lb 218 lb  HEIGHT 5\' 4"  5\' 4"  5\' 3"   BMI 37.57 kg/m2 38.6 kg/m2 38.63 kg/m2    Current exercise per week 90 minutes.

## 2014-06-05 NOTE — Patient Instructions (Addendum)
F/u in 4.5 month, call if you need me before  You are referred to dietitian for help with weight loss.  Commit to 1500 cal per day  Weight loss goal of 4 pounds per month    Food diary necessary to track in  Exercise for 30 to 60 mins per day  You are treated for acute sinusitis and bronchitis, with 10 day course of penicillin and fluconazole sent for use for vaginal yeast infection if this develops  Work excuse today  Please work on good  health habits so that your health will improve. 1. Commitment to daily physical activity for 30 to 60  minutes, if you are able to do this.  2. Commitment to wise food choices. Aim for half of your  food intake to be vegetable and fruit, one quarter starchy foods, and one quarter protein. Try to eat on a regular schedule  3 meals per day, snacking between meals should be limited to vegetables or fruits or small portions of nuts. 64 ounces of water per day is generally recommended, unless you have specific health conditions, like heart failure or kidney failure where you will need to limit fluid intake.  3. Commitment to sufficient and a  good quality of physical and mental rest daily, generally between 6 to 8 hours per day.  WITH PERSISTANCE AND PERSEVERANCE, THE IMPOSSIBLE , BECOMES THE NORM! [facility], we consider it a privelige to serve you.    Labs today

## 2014-06-05 NOTE — Assessment & Plan Note (Signed)
Symptomatic with positive strep exposure, rapid strep test is negative, will treat presumptively, also work excuse x 1 day

## 2014-06-06 LAB — CBC WITH DIFFERENTIAL/PLATELET
BASOS ABS: 0.1 10*3/uL (ref 0.0–0.1)
BASOS PCT: 1 % (ref 0–1)
Eosinophils Absolute: 0.4 10*3/uL (ref 0.0–0.7)
Eosinophils Relative: 4 % (ref 0–5)
HEMATOCRIT: 38.6 % (ref 36.0–46.0)
Hemoglobin: 12.7 g/dL (ref 12.0–15.0)
LYMPHS ABS: 3.1 10*3/uL (ref 0.7–4.0)
LYMPHS PCT: 34 % (ref 12–46)
MCH: 26.9 pg (ref 26.0–34.0)
MCHC: 32.9 g/dL (ref 30.0–36.0)
MCV: 81.8 fL (ref 78.0–100.0)
MONO ABS: 0.4 10*3/uL (ref 0.1–1.0)
MPV: 10.6 fL (ref 8.6–12.4)
Monocytes Relative: 4 % (ref 3–12)
NEUTROS PCT: 57 % (ref 43–77)
Neutro Abs: 5.2 10*3/uL (ref 1.7–7.7)
Platelets: 469 10*3/uL — ABNORMAL HIGH (ref 150–400)
RBC: 4.72 MIL/uL (ref 3.87–5.11)
RDW: 15.2 % (ref 11.5–15.5)
WBC: 9.2 10*3/uL (ref 4.0–10.5)

## 2014-06-06 LAB — VITAMIN D 25 HYDROXY (VIT D DEFICIENCY, FRACTURES): VIT D 25 HYDROXY: 19 ng/mL — AB (ref 30–100)

## 2014-06-09 ENCOUNTER — Encounter: Payer: Self-pay | Admitting: Family Medicine

## 2014-06-10 ENCOUNTER — Other Ambulatory Visit: Payer: Self-pay

## 2014-06-10 MED ORDER — VITAMIN D (ERGOCALCIFEROL) 1.25 MG (50000 UNIT) PO CAPS: 50000.0000 [IU] | ORAL_CAPSULE | ORAL | Status: DC

## 2014-09-25 ENCOUNTER — Telehealth: Payer: Self-pay | Admitting: *Deleted

## 2014-09-25 ENCOUNTER — Other Ambulatory Visit: Payer: Self-pay

## 2014-09-25 MED ORDER — AZITHROMYCIN 250 MG PO TABS: ORAL_TABLET | ORAL | Status: DC

## 2014-09-25 NOTE — Telephone Encounter (Signed)
pls notify and send in z pack, recommend sudafed  one to two daily for sinus pressure for next 5 days, a lot of fluid intake, saline nasal sprays , tylenol as directed for body ache

## 2014-09-25 NOTE — Telephone Encounter (Signed)
pls send a know

## 2014-09-25 NOTE — Telephone Encounter (Signed)
Called and left message for patient notifying of rx.  Med sent to pharmacy  

## 2014-09-25 NOTE — Telephone Encounter (Signed)
Patient aware.

## 2014-09-25 NOTE — Telephone Encounter (Signed)
Spoke with patient and she states that symptoms started yesterday.   Does have chills unknown if fever.  She is taking Ibuprofen and Sudafed without much relief.  Did cough up some brown/green sputum.  Please advise. She is not currently taking and allergy meds and no nasal sprays.

## 2014-09-25 NOTE — Telephone Encounter (Signed)
[name] called wanting to see Dr. Lodema Hong this afternoon I made her aware that Dr. Lodema Hong is leaving at 72 today, Patient stated she has a sinus infection that started yesterday she has tried taking sudaffed and it is not helping, patient stated she is having ear pain, pressure in her nose all the way to her head, patient said she feels like a train has hit her, Patient is requesting to see what she can do for this sinus infection.

## 2014-10-15 ENCOUNTER — Ambulatory Visit: Payer: BLUE CROSS/BLUE SHIELD | Admitting: Nutrition

## 2014-10-22 ENCOUNTER — Encounter: Payer: Self-pay | Admitting: Nutrition

## 2014-10-22 ENCOUNTER — Encounter: Payer: BLUE CROSS/BLUE SHIELD | Attending: Family Medicine | Admitting: Nutrition

## 2014-10-22 VITALS — Ht 63.0 in | Wt 210.6 lb

## 2014-10-22 DIAGNOSIS — R739 Hyperglycemia, unspecified: Secondary | ICD-10-CM

## 2014-10-22 DIAGNOSIS — E669 Obesity, unspecified: Secondary | ICD-10-CM

## 2014-10-22 NOTE — Progress Notes (Signed)
  Medical Nutrition Therapy:  Appt start time: 1530 end time:  1700.  Assessment:  Primary concerns today: Prediabetes Diabetes and obesity .  A1C 5.9%. Strong family history of DM. Has a child 18 months. Was not Gestational Diabetic.  Lost 15 lbs in the last 6 months. Wt ranges 215 lto 230 lbs.  230 lbs is the hightest she has weighted. Short tem is to lose 5- 10 lbs per month. Wants to get down to 165-180 lbs. Likes to run but has been having knee issues.  Eats three meals per day. Works out on exercise 30 mins or longer most days of the week. Wants to avoid DM. Diet is well balanced by inadequate in carbohydrates.   Lab Results  Component Value Date   HGBA1C 5.9* 06/05/2014    Lab Results  Component Value Date   CHOL 160 06/05/2014   HDL 52 06/05/2014   LDLCALC 92 06/05/2014   TRIG 78 06/05/2014   CHOLHDL 3.1 06/05/2014    Wt Readings from Last 3 Encounters:  10/22/14 210 lb 9.6 oz (95.528 kg)  06/05/14 219 lb (99.338 kg)  02/25/14 225 lb (102.059 kg)   Ht Readings from Last 3 Encounters:  10/22/14 5\' 3"  (1.6 m)  06/05/14 5\' 4"  (1.626 m)  02/25/14 5\' 4"  (1.626 m)   Body mass index is 37.32 kg/(m^2).  Preferred Learning Style:  Auditory  Visual  Hands on   Learning Readiness:   Not ready  Contemplating  Ready   MEDICATIONS: See List   DIETARY INTAKE:  Eats three meals per day. Sometimes snacks on popcorn.  Usual physical activity: 30 minutes per day most days.  Estimated energy needs: 1500 calories 170 g carbohydrates 112 g protein 42 g fat  Progress Towards Goal(s):  In progress.   Nutritional Diagnosis:  NB-1.1 Food and nutrition-related knowledge deficit As related to Hyperglycemia.  As evidenced by A1C 5.9%.Marland Kitchen.  Excessive calorie intake as evidenced by BMI > 30.  Intervention: Nutrition education provided on low fat low sodium high fiber diet, meal planning, MY Plate, portion sizes, timing of meals, avoiding snacks, increasing fiber rich foods,  avoiding emotional eating, drinking water and exercising 30 mins 5 days per week.  .Teaching Method Utilized:  Visual Auditory Hands on  Handouts given during visit include:  The Plate Method  Meal Plan Card  Strategies for weight loss  Barriers to learning/adherence to lifestyle change: None  Demonstrated degree of understanding via:  Teach Back   Monitoring/Evaluation:  Dietary intake, exercise, meal planning, and body weight in 1 month(s).

## 2014-10-22 NOTE — Patient Instructions (Signed)
Goal 1. Follow the Plate Method. 2.  Eat 2 carb choices per meals. 3. Exercise 30-45 minutes per day. 4. Avoid snacks between meals. 5. Lose 1 lb per week. 6. Keep food journal.

## 2014-11-13 ENCOUNTER — Telehealth: Payer: Self-pay | Admitting: Family Medicine

## 2014-11-13 DIAGNOSIS — R7303 Prediabetes: Secondary | ICD-10-CM

## 2014-11-13 LAB — HEMOGLOBIN A1C
HEMOGLOBIN A1C: 5.8 % — AB (ref <5.7)
MEAN PLASMA GLUCOSE: 120 mg/dL — AB (ref <117)

## 2014-11-13 NOTE — Telephone Encounter (Signed)
Pt aware and order sent/  

## 2014-11-13 NOTE — Telephone Encounter (Signed)
Patient has an appointment with Dr. Lodema HongSimpson Monday and she is asking is she to have labs drawn before, please advise?

## 2014-11-14 LAB — BASIC METABOLIC PANEL
BUN: 17 mg/dL (ref 7–25)
CO2: 25 mmol/L (ref 20–31)
CREATININE: 0.79 mg/dL (ref 0.50–1.10)
Calcium: 9.2 mg/dL (ref 8.6–10.2)
Chloride: 102 mmol/L (ref 98–110)
Glucose, Bld: 82 mg/dL (ref 65–99)
POTASSIUM: 4.9 mmol/L (ref 3.5–5.3)
Sodium: 136 mmol/L (ref 135–146)

## 2014-11-17 ENCOUNTER — Encounter: Payer: Self-pay | Admitting: Family Medicine

## 2014-11-17 ENCOUNTER — Ambulatory Visit (INDEPENDENT_AMBULATORY_CARE_PROVIDER_SITE_OTHER): Payer: Managed Care, Other (non HMO) | Admitting: Family Medicine

## 2014-11-17 VITALS — BP 116/72 | HR 86 | Resp 18 | Ht 64.0 in | Wt 206.1 lb

## 2014-11-17 DIAGNOSIS — Z23 Encounter for immunization: Secondary | ICD-10-CM | POA: Diagnosis not present

## 2014-11-17 DIAGNOSIS — M79671 Pain in right foot: Secondary | ICD-10-CM

## 2014-11-17 DIAGNOSIS — K219 Gastro-esophageal reflux disease without esophagitis: Secondary | ICD-10-CM

## 2014-11-17 DIAGNOSIS — R7302 Impaired glucose tolerance (oral): Secondary | ICD-10-CM | POA: Insufficient documentation

## 2014-11-17 DIAGNOSIS — K5900 Constipation, unspecified: Secondary | ICD-10-CM

## 2014-11-17 NOTE — Patient Instructions (Signed)
F/u in 5 month, cLL IF YOU NEED ME BEFORE  cONGRTAS   WEIGHT LOSS GOAL OF 12 TO 16 POUNDS   Hba1c IN 5.5 MONTHS  nODULE ON RIGTH FOOT, WILL GET XRAY TODAY  FLU VACCINE TODAY  Thanks for choosing [facility], we consider it a privelige to serve you. Please work on good  health habits so that your health will improve. 1. Commitment to daily physical activity for 30 to 60  minutes, if you are able to do this.  2. Commitment to wise food choices. Aim for half of your  food intake to be vegetable and fruit, one quarter starchy foods, and one quarter protein. Try to eat on a regular schedule  3 meals per day, snacking between meals should be limited to vegetables or fruits or small portions of nuts. 64 ounces of water per day is generally recommended, unless you have specific health conditions, like heart failure or kidney failure where you will need to limit fluid intake.  3. Commitment to sufficient and a  good quality of physical and mental rest daily, generally between 6 to 8 hours per day.  WITH PERSISTANCE AND PERSEVERANCE, THE IMPOSSIBLE , BECOMES THE NORM!

## 2014-11-17 NOTE — Assessment & Plan Note (Signed)
Controlled, no change in medication  

## 2014-11-17 NOTE — Assessment & Plan Note (Signed)
Improved with lifestyle change. 

## 2014-11-17 NOTE — Assessment & Plan Note (Signed)
Improved Patient educated about the importance of limiting  Carbohydrate intake , the need to commit to daily physical activity for a minimum of 30 minutes , and to commit weight loss. The fact that changes in all these areas will reduce or eliminate all together the development of diabetes is stressed.   Diabetic Labs Latest Ref Rng 11/13/2014 06/05/2014 08/15/2012 06/24/2009 04/10/2006  HbA1c <5.7 % 5.8(H) 5.9(H) 5.5 - -  Chol 0 - 200 mg/dL - 161160 096136 - 045130  HDL >=40>=46 mg/dL - 52 50 - 40  Calc LDL 0 - 99 mg/dL - 92 74 - 77  Triglycerides <150 mg/dL - 78 60 - 67  Creatinine 0.50 - 1.10 mg/dL 9.810.79 1.910.66 4.780.71 2.950.72 6.210.76   BP/Weight 11/17/2014 10/22/2014 06/05/2014 02/25/2014 12/29/2013 11/19/2013 04/08/2013  Systolic BP 116 - 118 116 122 308108 103  Diastolic BP 72 - 82 69 96 74 57  Wt. (Lbs) 206.12 210.6 219 225 218 219.12 -  BMI 35.36 37.32 37.57 38.6 38.63 38.83 -   No flowsheet data found.

## 2014-11-17 NOTE — Assessment & Plan Note (Signed)
After obtaining informed consent, the vaccine is  administered by LPN.  

## 2014-11-17 NOTE — Assessment & Plan Note (Signed)
Localized tender nodule lateral aspect of right foot, will obtain xray, no intervention needed, pain only occurs with direct pressure

## 2014-11-17 NOTE — Progress Notes (Signed)
Subjective:    Patient ID: [name], female    DOB: February 23, 1975, 39 y.o.   MRN: [id number]  HPI   [name]     MRN: [id number]      DOB: [birth year]   HPI [name] is here for follow up and re-evaluation of chronic medical conditions, medication management and review of any available recent lab and radiology data.  Preventive health is updated, specifically  Cancer screening and Immunization.   Questions or concerns regarding consultations or procedures which the PT has had in the interim are  addressed. The PT denies any adverse reactions to current medications since the last visit.  2 month h/o tender swelling on right foot, no prior trauma, only painful on direct palpation ROS Denies recent fever or chills. Denies sinus pressure, nasal congestion, ear pain or sore throat. Denies chest congestion, productive cough or wheezing. Denies chest pains, palpitations and leg swelling Denies abdominal pain, nausea, vomiting,diarrhea or constipation.   Denies dysuria, frequency, hesitancy or incontinence. Denies joint pain, swelling and limitation in mobility. Denies headaches, seizures, numbness, or tingling. Denies depression, anxiety or insomnia. Denies skin break down or rash.   PE  BP 116/72 mmHg  Pulse 86  Resp 18  Ht  (1.626 m)  Wt 206 lb 1.9 oz (93.495 kg)  BMI 35.36 kg/m2  SpO2 94%  LMP 10/27/2014 (Exact Date)  Patient alert and oriented and in no cardiopulmonary distress.  HEENT: No facial asymmetry, EOMI,   oropharynx pink and moist.  Neck supple no JVD, no mass.  Chest: Clear to auscultation bilaterally.  CVS: S1, S2 no murmurs, no S3.Regular rate.  ABD: Soft non tender.   Ext: No edema  MS: Adequate ROM spine, shoulders, hips and knees.Tender nodule pea sized on ;lateral aspect of right foot  Skin: Intact, no ulcerations or rash noted.  Psych: Good eye contact, normal affect. Memory intact not anxious or depressed  appearing.  CNS: CN 2-12 intact, power,  normal throughout.no focal deficits noted.   Assessment & Plan  Morbid obesity Improved Patient re-educated about  the importance of commitment to a  minimum of 150 minutes of exercise per week.  The importance of healthy food choices with portion control discussed. Encouraged to start a food diary, count calories and to consider  joining a support group. Sample diet sheets offered. Goals set by the patient for the next several months.   Weight /BMI 11/17/2014 10/22/2014 06/05/2014  WEIGHT 206 lb 1.9 oz 210 lb 9.6 oz 219 lb  HEIGHT     BMI 35.36 kg/m2 37.32 kg/m2 37.57 kg/m2    Current exercise per week 150 minutes.   GERD Controlled, no change in medication   Constipation Improved with lifestyle change  Foot pain, right Localized tender nodule lateral aspect of right foot, will obtain xray, no intervention needed, pain only occurs with direct pressure   Need for prophylactic vaccination and inoculation against influenza After obtaining informed consent, the vaccine is  administered by LPN.   IGT (impaired glucose tolerance) Improved Patient educated about the importance of limiting  Carbohydrate intake , the need to commit to daily physical activity for a minimum of 30 minutes , and to commit weight loss. The fact that changes in all these areas will reduce or eliminate all together the development of diabetes is stressed.   Diabetic Labs Latest Ref Rng 11/13/2014 06/05/2014 08/15/2012 06/24/2009 04/10/2006  HbA1c <5.7 % 5.8(H) 5.9(H) 5.5 - -  Chol 0 - 200 mg/dL - 409160 811136 - 914130  HDL >=78>=46 mg/dL - 52 50 - 40  Calc LDL 0 - 99 mg/dL - 92 74 - 77  Triglycerides <150 mg/dL - 78 60 - 67  Creatinine 0.50 - 1.10 mg/dL 2.950.79 6.210.66 3.080.71 6.570.72 8.460.76   BP/Weight 11/17/2014 10/22/2014 06/05/2014 02/25/2014 12/29/2013 11/19/2013 04/08/2013  Systolic BP 116 - 118 116 122 962108 103  Diastolic BP 72 - 82 69 96 74 57  Wt. (Lbs) 206.12 210.6 219  225 218 219.12 -  BMI 35.36 37.32 37.57 38.6 38.63 38.83 -   No flowsheet data found.           Review of Systems     Objective:   Physical Exam        Assessment & Plan:

## 2014-11-17 NOTE — Assessment & Plan Note (Signed)
Improved Patient re-educated about  the importance of commitment to a  minimum of 150 minutes of exercise per week.  The importance of healthy food choices with portion control discussed. Encouraged to start a food diary, count calories and to consider  joining a support group. Sample diet sheets offered. Goals set by the patient for the next several months.   Weight /BMI 11/17/2014 10/22/2014 06/05/2014  WEIGHT 206 lb 1.9 oz 210 lb 9.6 oz 219 lb  HEIGHT 5\' 4"  5\' 3"  5\' 4"   BMI 35.36 kg/m2 37.32 kg/m2 37.57 kg/m2    Current exercise per week 150 minutes.

## 2014-12-01 ENCOUNTER — Ambulatory Visit: Payer: BLUE CROSS/BLUE SHIELD | Admitting: Nutrition

## 2014-12-13 IMAGING — US US OB TRANSVAGINAL
1 series · 14 of 28 positions shown · non-contrast
Comparison: None

CLINICAL DATA: Pelvic pain, positive pregnancy test

OBSTETRIC <14 WK US AND TRANSVAGINAL OB US
TECHNIQUE: Both transabdominal and transvaginal ultrasound
examinations were performed for complete evaluation of the
gestation as well as the maternal uterus, adnexal regions, and
pelvic cul-de-sac.  Transvaginal technique was performed to assess
early pregnancy.

[Series 1: us ob transvaginal · 0.27mm/px · 14 of 117 slices shown]
[im 5/117]
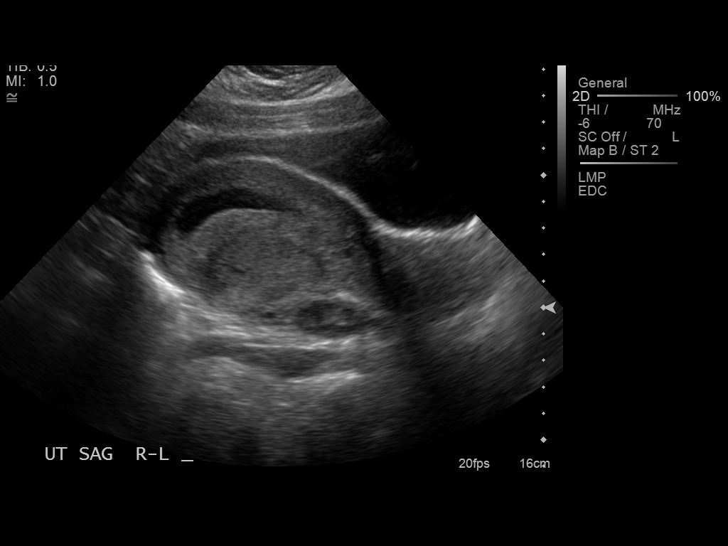
[im 13/117]
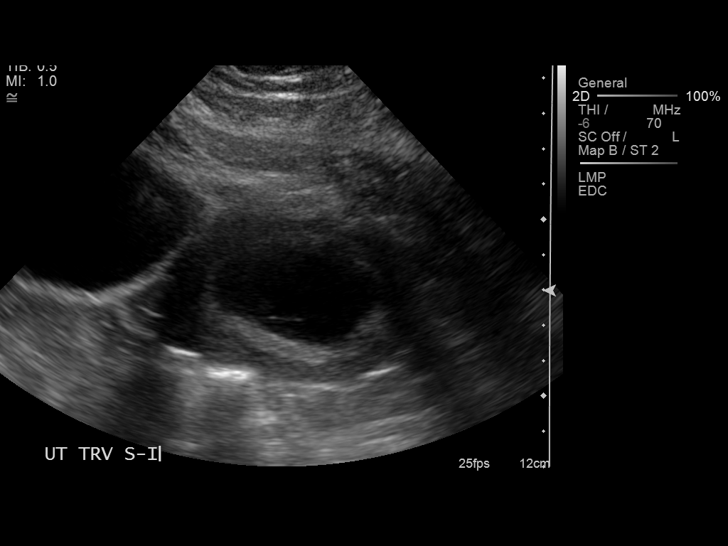
[im 22/117]
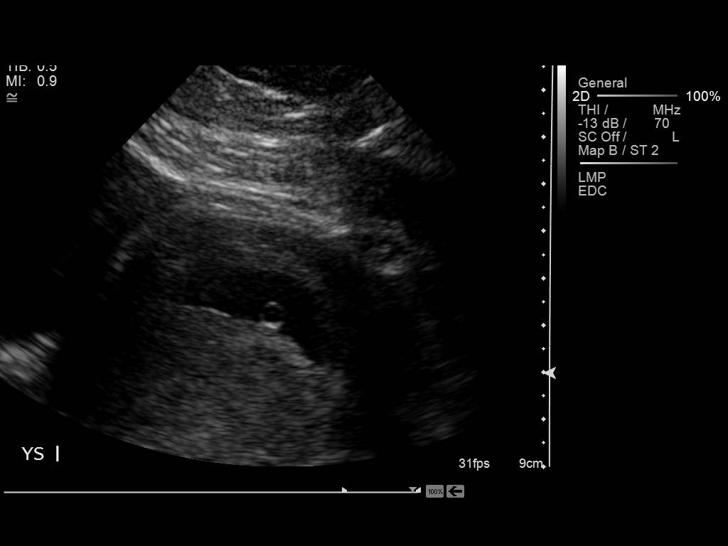
[im 31/117]
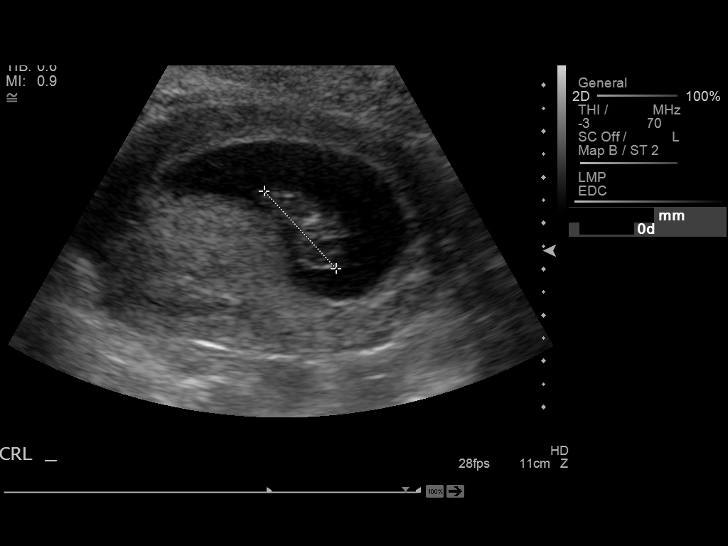
[im 39/117]
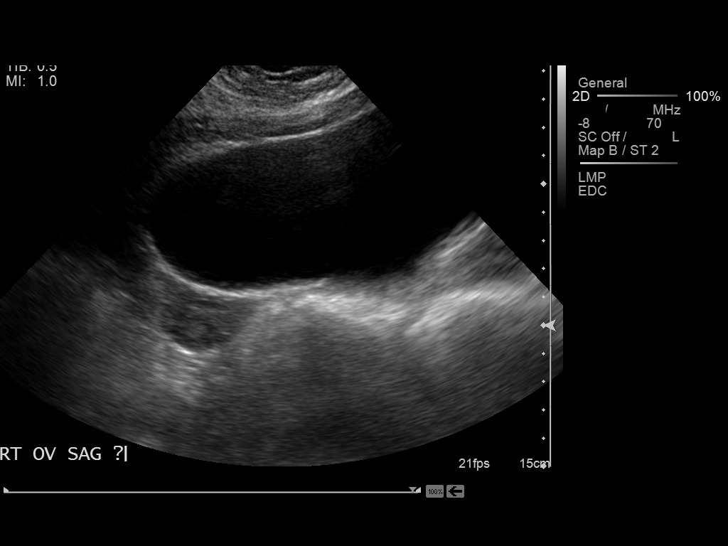
[im 48/117]
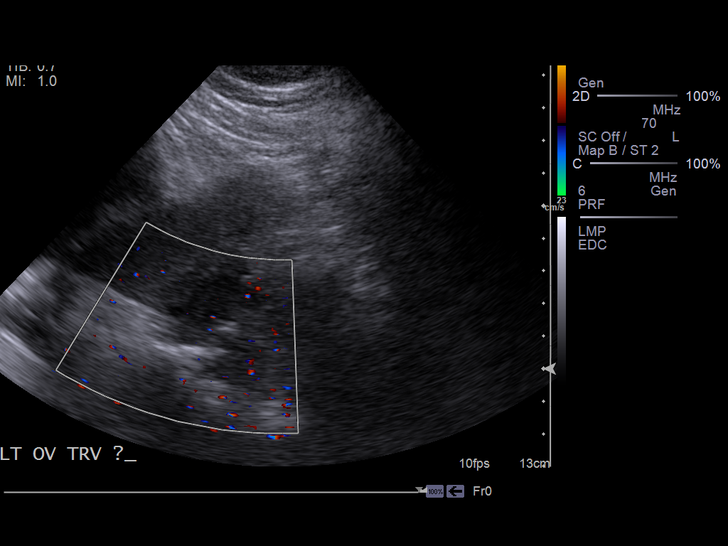
[im 56/117]
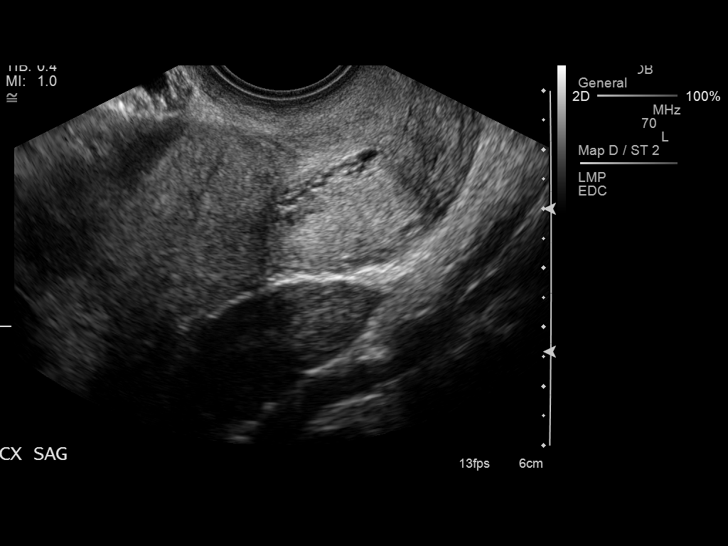
[im 65/117]
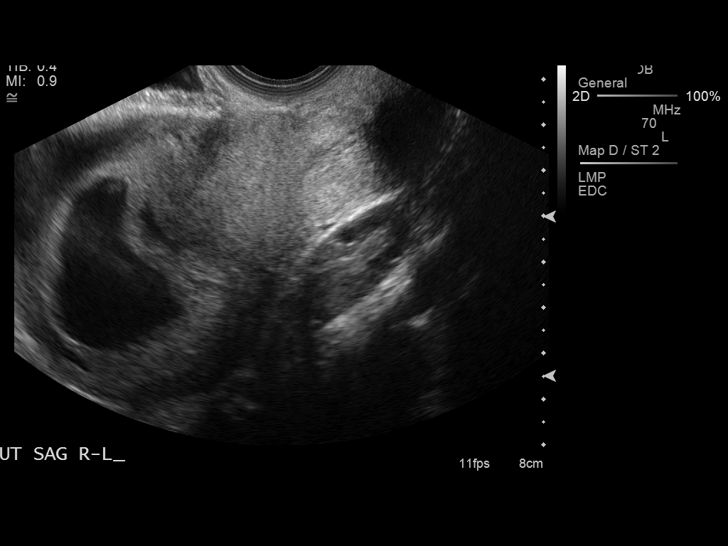
[im 74/117]
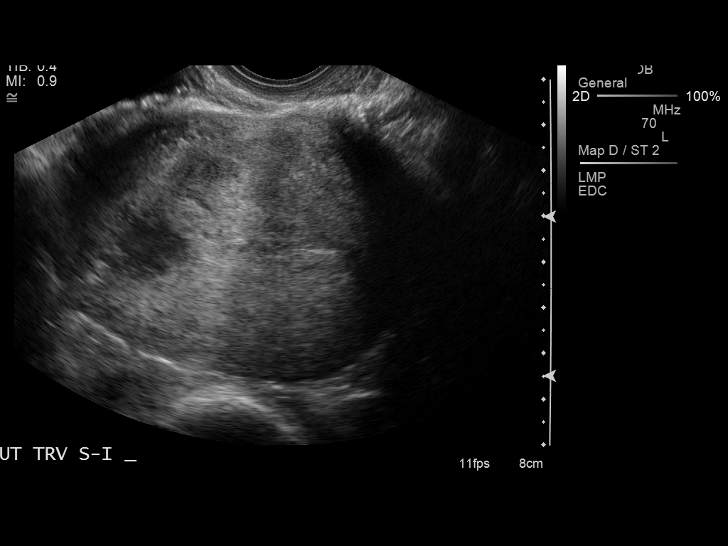
[im 82/117]
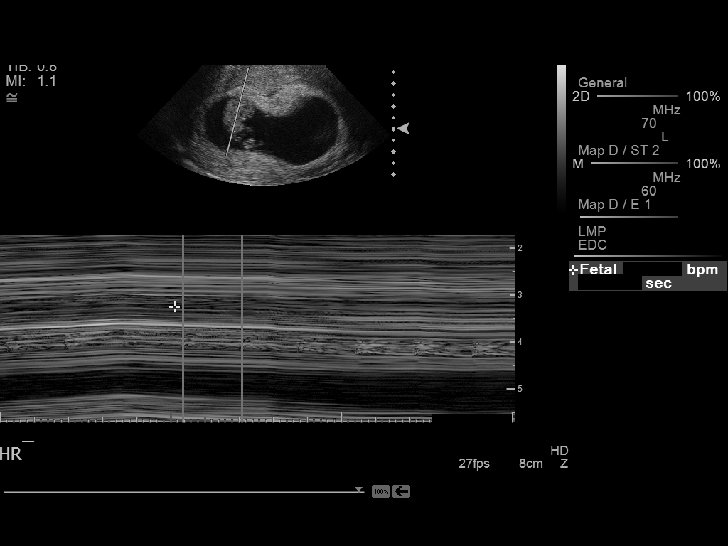
[im 91/117]
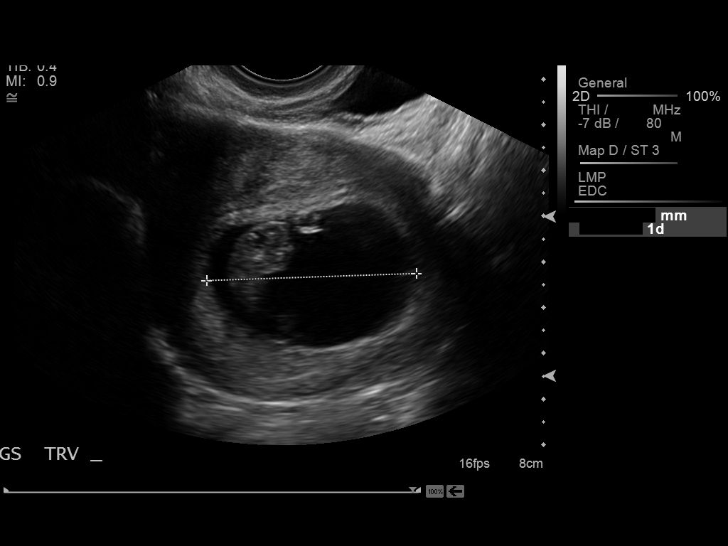
[im 99/117]
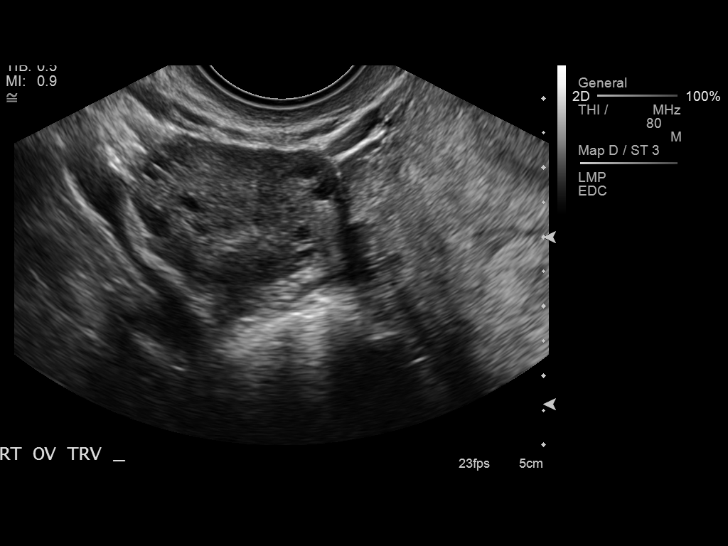
[im 108/117]
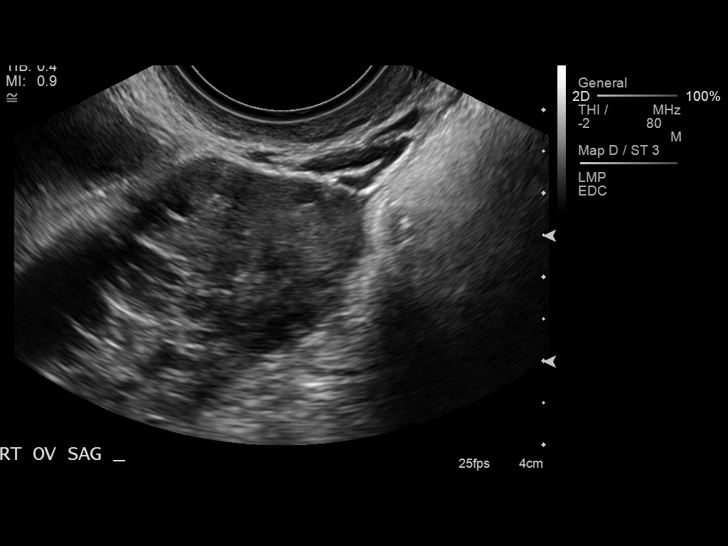
[im 117/117]
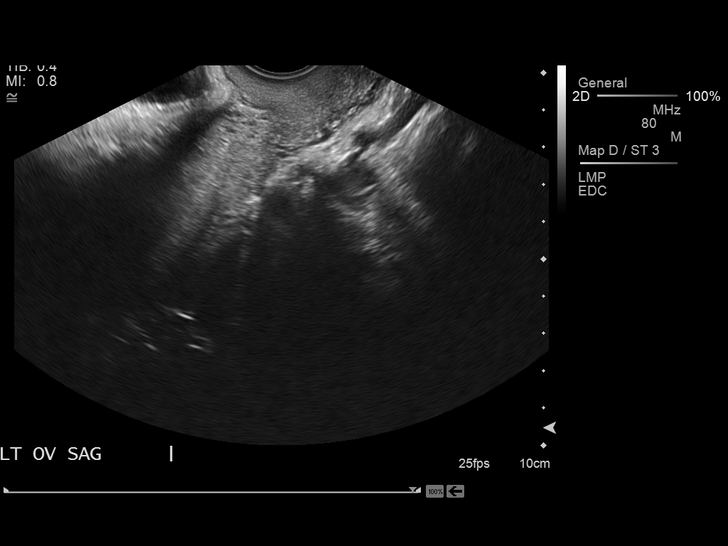

[14 of 28 positions shown; findings below may reference images not displayed]

Intrauterine gestational sac:  Visualized/normal in shape.
Yolk sac: Present
Embryo: Present
Cardiac Activity: Present
Heart Rate: 178 bpm

CRL: 22.0  mm  8 w  6 d             US EDC: 04/04/2013

Maternal uterus/adnexae:
Small subchorionic hemorrhage 9 mm greatest diameter.
No free pelvic fluid.
Ovaries unremarkable.
No adnexal masses.
IMPRESSION: Single live early intrauterine gestation measured at 8 weeks 6 days
EGA by crown-rump length.
Small subchorionic hemorrhage.

## 2014-12-18 ENCOUNTER — Encounter: Payer: Managed Care, Other (non HMO) | Attending: Family Medicine | Admitting: Nutrition

## 2014-12-18 ENCOUNTER — Encounter: Payer: Self-pay | Admitting: Nutrition

## 2014-12-18 VITALS — Ht 63.0 in | Wt 209.0 lb

## 2014-12-18 DIAGNOSIS — Z713 Dietary counseling and surveillance: Secondary | ICD-10-CM | POA: Diagnosis not present

## 2014-12-18 DIAGNOSIS — R739 Hyperglycemia, unspecified: Secondary | ICD-10-CM

## 2014-12-18 DIAGNOSIS — Z6837 Body mass index (BMI) 37.0-37.9, adult: Secondary | ICD-10-CM | POA: Diagnosis not present

## 2014-12-18 DIAGNOSIS — E669 Obesity, unspecified: Secondary | ICD-10-CM

## 2014-12-18 NOTE — Progress Notes (Signed)
  Medical Nutrition Therapy:  Appt start time: 1530 end time:  1700.  Assessment:  Primary concerns today: Prediabetes Diabetes and obesity .  A1C 5.8%.  Lost 1 lb Changes: smaller portions, following the meal plan, timing of meals. Feels better, less bloated. More energy; not as thirsty or constipated. She is working on incorporating exercise and will start using walking tapes at home after her child has gone to bed to exercise. Diet is well balanced by inadequate in carbohydrates.    Lab Results  Component Value Date   HGBA1C 5.8* 11/13/2014    Lab Results  Component Value Date   CHOL 160 06/05/2014   HDL 52 06/05/2014   LDLCALC 92 06/05/2014   TRIG 78 06/05/2014   CHOLHDL 3.1 06/05/2014    Wt Readings from Last 3 Encounters:  11/17/14 206 lb 1.9 oz (93.495 kg)  10/22/14 210 lb 9.6 oz (95.528 kg)  06/05/14 219 lb (99.338 kg)   Ht Readings from Last 3 Encounters:  11/17/14 5\' 4"  (1.626 m)  10/22/14 5\' 3"  (1.6 m)  06/05/14 5\' 4"  (1.626 m)   There is no weight on file to calculate BMI.  Preferred Learning Style:  Auditory  Visual  Hands on   Learning Readiness:   Not ready  Contemplating  Ready   MEDICATIONS: See List   DIETARY INTAKE:  B) Spinach egg and ww toast; water L) grilled chicken salad and 3 gram crackers, apple,  D) 2 chicken tenders 1/2 sweet ptoate OR [place] patty or baked and grilled hicken with salad and fruit.  Estimated energy needs: 1500 calories 170 g carbohydrates 112 g protein 42 g fat  Progress Towards Goal(s):  In progress.   Nutritional Diagnosis:  NB-1.1 Food and nutrition-related knowledge deficit As related to Hyperglycemia.  As evidenced by A1C 5.9%.Marland Kitchen.  Excessive calorie intake as evidenced by BMI > 30.  Intervention: Nutrition education provided on low fat low sodium high fiber diet, meal planning, MY Plate, portion sizes, timing of meals, avoiding snacks, increasing fiber rich foods, avoiding emotional eating, drinking  water and exercising 30 mins 5 days per week.  Goals 1.Follow Plate Method 2. Do the exercise tapes three times per week. 3. Lose 10 lbs by next visit. 4. Plan ahead for holiday meal. 5. Get A1C les than 5.7%.  .Teaching Method Utilized:  Visual Auditory Hands on  Handouts given during visit include:  The Plate Method  Meal Plan Card  Strategies for weight loss  Barriers to learning/adherence to lifestyle change: None  Demonstrated degree of understanding via:  Teach Back   Monitoring/Evaluation:  Dietary intake, exercise, meal planning, and body weight in 1 month(s).

## 2014-12-18 NOTE — Patient Instructions (Signed)
Goals 1.Follow Plate Method 2. DO the exercise tapes three times per week. 3. Lose 10 lbs by next visit. 4. Plan ahead for holiday meal. 5. Get A1C les than 5.7%.

## 2014-12-25 ENCOUNTER — Ambulatory Visit: Payer: BLUE CROSS/BLUE SHIELD | Admitting: Family Medicine

## 2015-03-19 ENCOUNTER — Ambulatory Visit: Payer: Managed Care, Other (non HMO) | Admitting: Nutrition

## 2015-04-20 ENCOUNTER — Ambulatory Visit: Payer: Managed Care, Other (non HMO) | Admitting: Family Medicine

## 2015-06-17 ENCOUNTER — Encounter: Payer: Self-pay | Admitting: Family Medicine

## 2015-06-17 ENCOUNTER — Ambulatory Visit (INDEPENDENT_AMBULATORY_CARE_PROVIDER_SITE_OTHER): Payer: Managed Care, Other (non HMO) | Admitting: Family Medicine

## 2015-06-17 VITALS — BP 120/82 | HR 62 | Resp 16 | Ht 63.0 in | Wt 223.1 lb

## 2015-06-17 DIAGNOSIS — Z1239 Encounter for other screening for malignant neoplasm of breast: Secondary | ICD-10-CM

## 2015-06-17 DIAGNOSIS — K219 Gastro-esophageal reflux disease without esophagitis: Secondary | ICD-10-CM

## 2015-06-17 DIAGNOSIS — Z13220 Encounter for screening for lipoid disorders: Secondary | ICD-10-CM | POA: Diagnosis not present

## 2015-06-17 DIAGNOSIS — E559 Vitamin D deficiency, unspecified: Secondary | ICD-10-CM

## 2015-06-17 DIAGNOSIS — K5909 Other constipation: Secondary | ICD-10-CM

## 2015-06-17 DIAGNOSIS — R7302 Impaired glucose tolerance (oral): Secondary | ICD-10-CM

## 2015-06-17 NOTE — Patient Instructions (Addendum)
F/u in 5 months, call if you need me before  Fasting labs asap  Please work on good  health habits so that your health will improve. 1. Commitment to daily physical activity for 30 to 60  minutes, if you are able to do this.  2. Commitment to wise food choices. Aim for half of your  food intake to be vegetable and fruit, one quarter starchy foods, and one quarter protein. Try to eat on a regular schedule  3 meals per day, snacking between meals should be limited to vegetables or fruits or small portions of nuts. 64 ounces of water per day is generally recommended, unless you have specific health conditions, like heart failure or kidney failure where you will need to limit fluid intake.  3. Commitment to sufficient and a  good quality of physical and mental rest daily, generally between 6 to 8 hours per day.  WITH PERSISTANCE AND PERSEVERANCE, THE IMPOSSIBLE , BECOMES THE NORM!   Weight loss goal of 12 to 15 pounds  You are referred for mammogram in July  Use claritin and saline  flushes for cleansing nostrils

## 2015-06-17 NOTE — Progress Notes (Signed)
   Subjective:    Patient ID: [name], female    DOB: 02/08/1975, 40 y.o.   MRN: [id number]  HPI    Review of Systems     Objective:   Physical Exam        Assessment & Plan:

## 2015-06-27 NOTE — Assessment & Plan Note (Signed)
Deteriorated. Patient re-educated about  the importance of commitment to a  minimum of 150 minutes of exercise per week.  The importance of healthy food choices with portion control discussed. Encouraged to start a food diary, count calories and to consider  joining a support group. Sample diet sheets offered. Goals set by the patient for the next several months.   Weight /BMI 06/17/2015 12/18/2014 11/17/2014  WEIGHT 223 lb 1.9 oz 209 lb 206 lb 1.9 oz  HEIGHT 5\' 3"  5\' 3"  5\' 4"   BMI 39.53 kg/m2 37.03 kg/m2 35.36 kg/m2    Current exercise per week 60 minutes.

## 2015-06-27 NOTE — Assessment & Plan Note (Signed)
Updated lab needed at/ before next visit.   

## 2015-06-27 NOTE — Assessment & Plan Note (Signed)
Educated re high fiber diet rich in vegetable and fruit miralax as needed

## 2015-06-27 NOTE — Progress Notes (Signed)
Brita RompShelitha P Ellery     MRN: [id number]      DOB: 07/19/1975   HPI Ms. Weston Annallington is here for follow up and re-evaluation of chronic medical conditions, medication management and review of any available recent lab and radiology data.  Preventive health is updated, specifically  Cancer screening and Immunization.   Questions or concerns regarding consultations or procedures which the PT has had in the interim are  addressed. The PT denies any adverse reactions to current medications since the last visit.  There are no new concerns.  There are no specific complaints   ROS Denies recent fever or chills. Denies sinus pressure, , ear pain or sore throat.c/o nasal congestion Denies chest congestion, productive cough or wheezing. Denies chest pains, palpitations and leg swelling Denies abdominal pain, nausea, vomiting,diarrhea does have reflux symptoms and constipation from time to time, her medications work when she needs them constipation.   Denies dysuria, frequency, hesitancy or incontinence. Denies joint pain, swelling and limitation in mobility. Denies headaches, seizures, numbness, or tingling. Denies depression, anxiety or insomnia. Denies skin break down or rash.   PE  BP 120/82 mmHg  Pulse 62  Resp 16  Ht 5\' 3"  (1.6 m)  Wt 223 lb 1.9 oz (101.207 kg)  BMI 39.53 kg/m2  SpO2 100%  Patient alert and oriented and in no cardiopulmonary distress.  HEENT: No facial asymmetry, EOMI,   oropharynx pink and moist.  Neck supple no JVD, no mass. No sinus tenderness, TM clear , no cervical adenopathyChest: Clear to auscultation bilaterally.Mild erythema and edema of nasal mucosa CVS: S1, S2 no murmurs, no S3.Regular rate.  ABD: Soft non tender.   Ext: No edema  MS: Adequate ROM spine, shoulders, hips and knees.  Skin: Intact, no ulcerations or rash noted.  Psych: Good eye contact, normal affect. Memory intact not anxious or depressed appearing.  CNS: CN 2-12 intact, power,   normal throughout.no focal deficits noted.   Assessment & Plan   GERD Controlled, no change in medication   Morbid obesity Deteriorated. Patient re-educated about  the importance of commitment to a  minimum of 150 minutes of exercise per week.  The importance of healthy food choices with portion control discussed. Encouraged to start a food diary, count calories and to consider  joining a support group. Sample diet sheets offered. Goals set by the patient for the next several months.   Weight /BMI 06/17/2015 12/18/2014 11/17/2014  WEIGHT 223 lb 1.9 oz 209 lb 206 lb 1.9 oz  HEIGHT 5\' 3"  5\' 3"  5\' 4"   BMI 39.53 kg/m2 37.03 kg/m2 35.36 kg/m2    Current exercise per week 60 minutes.   Vitamin D deficiency Updated lab needed at/ before next visit.   IGT (impaired glucose tolerance) Patient educated about the importance of limiting  Carbohydrate intake , the need to commit to daily physical activity for a minimum of 30 minutes , and to commit weight loss. The fact that changes in all these areas will reduce or eliminate all together the development of diabetes is stressed.   Diabetic Labs Latest Ref Rng 11/13/2014 06/05/2014 08/15/2012 06/24/2009 04/10/2006  HbA1c <5.7 % 5.8(H) 5.9(H) 5.5 - -  Chol 0 - 200 mg/dL - 347160 425136 - 956130  HDL >=38>=46 mg/dL - 52 50 - 40  Calc LDL 0 - 99 mg/dL - 92 74 - 77  Triglycerides <150 mg/dL - 78 60 - 67  Creatinine 0.50 - 1.10 mg/dL 7.560.79 4.330.66 2.950.71 1.880.72 4.160.76  BP/Weight 06/17/2015 12/18/2014 11/17/2014 10/22/2014 06/05/2014 02/25/2014 12/29/2013  Systolic BP 120 - 116 - 118 116 122  Diastolic BP 82 - 72 - 82 69 96  Wt. (Lbs) 223.12 209 206.12 210.6 219 225 218  BMI 39.53 37.03 35.36 37.32 37.57 38.6 38.63   No flowsheet data found.     Constipation Educated re high fiber diet rich in vegetable and fruit miralax as needed

## 2015-06-27 NOTE — Assessment & Plan Note (Signed)
Patient educated about the importance of limiting  Carbohydrate intake , the need to commit to daily physical activity for a minimum of 30 minutes , and to commit weight loss. The fact that changes in all these areas will reduce or eliminate all together the development of diabetes is stressed.   Diabetic Labs Latest Ref Rng 11/13/2014 06/05/2014 08/15/2012 06/24/2009 04/10/2006  HbA1c <5.7 % 5.8(H) 5.9(H) 5.5 - -  Chol 0 - 200 mg/dL - 098160 119136 - 147130  HDL >=82>=46 mg/dL - 52 50 - 40  Calc LDL 0 - 99 mg/dL - 92 74 - 77  Triglycerides <150 mg/dL - 78 60 - 67  Creatinine 0.50 - 1.10 mg/dL 9.560.79 2.130.66 0.860.71 5.780.72 4.690.76   BP/Weight 06/17/2015 12/18/2014 11/17/2014 10/22/2014 06/05/2014 02/25/2014 12/29/2013  Systolic BP 120 - 116 - 118 116 122  Diastolic BP 82 - 72 - 82 69 96  Wt. (Lbs) 223.12 209 206.12 210.6 219 225 218  BMI 39.53 37.03 35.36 37.32 37.57 38.6 38.63   No flowsheet data found.

## 2015-06-27 NOTE — Assessment & Plan Note (Signed)
Controlled, no change in medication  

## 2015-11-17 ENCOUNTER — Other Ambulatory Visit (HOSPITAL_COMMUNITY)
Admission: RE | Admit: 2015-11-17 | Discharge: 2015-11-17 | Disposition: A | Discharge status: Home / Self Care | Payer: Self-pay | Source: Ambulatory Visit | Attending: Family Medicine | Admitting: Family Medicine

## 2015-11-17 ENCOUNTER — Ambulatory Visit (INDEPENDENT_AMBULATORY_CARE_PROVIDER_SITE_OTHER): Payer: Self-pay | Admitting: Family Medicine

## 2015-11-17 ENCOUNTER — Encounter: Payer: Self-pay | Admitting: Family Medicine

## 2015-11-17 VITALS — BP 118/78 | HR 71 | Resp 16 | Ht 63.0 in | Wt 220.0 lb

## 2015-11-17 DIAGNOSIS — N760 Acute vaginitis: Secondary | ICD-10-CM

## 2015-11-17 DIAGNOSIS — Z124 Encounter for screening for malignant neoplasm of cervix: Secondary | ICD-10-CM

## 2015-11-17 DIAGNOSIS — R7302 Impaired glucose tolerance (oral): Secondary | ICD-10-CM

## 2015-11-17 DIAGNOSIS — Z01419 Encounter for gynecological examination (general) (routine) without abnormal findings: Secondary | ICD-10-CM | POA: Insufficient documentation

## 2015-11-17 DIAGNOSIS — Z1151 Encounter for screening for human papillomavirus (HPV): Secondary | ICD-10-CM | POA: Insufficient documentation

## 2015-11-17 MED ORDER — NORGESTIMATE-ETH ESTRADIOL 0.25-35 MG-MCG PO TABS: 1.0000 | ORAL_TABLET | Freq: Every day | ORAL | 11 refills | Status: DC

## 2015-11-17 NOTE — Patient Instructions (Addendum)
F/u in 5 months, call if you need me sooner, weight loss goal of 10 pounds  It is important that you exercise regularly at least 30 minutes 5 times a week. If you develop chest pain, have severe difficulty breathing, or feel very tired, stop exercising immediately and seek medical attention    Pap sent   Start birth control pills on DAY ONE of your next expected cycle, pregnancy test is negative  Schedule mammogram next year asap  Flu vaccine at pharmacy  Fasting labs  In January, cBC, fasting lipid. Chem 7 , TSH, vit D, TSH, HBA1C, HSV2  Topical antibiotic twice daily for 5 days, to prevent infection,. Skin irritated from friction,  Keep clean and dry

## 2015-11-23 ENCOUNTER — Encounter: Payer: Self-pay | Admitting: Family Medicine

## 2015-11-23 LAB — CYTOLOGY - PAP
DIAGNOSIS: NEGATIVE
HPV (WINDOPATH): NOT DETECTED
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
## 2015-11-23 NOTE — Assessment & Plan Note (Signed)
Open skin lesions on left labia majora, apparently from friction, antibiotic ointment provided for twice daily use for 5 days, and pt advised to keep area dry

## 2015-11-23 NOTE — Assessment & Plan Note (Signed)
Improved. Patient re-educated about  the importance of commitment to a  minimum of 150 minutes of exercise per week.  The importance of healthy food choices with portion control discussed. Encouraged to start a food diary, count calories and to consider  joining a support group. Sample diet sheets offered. Goals set by the patient for the next several months.   Weight /BMI 11/17/2015 06/17/2015 12/18/2014  WEIGHT 220 lb 223 lb 1.9 oz 209 lb  HEIGHT 5\' 3"  5\' 3"  5\' 3"   BMI 38.97 kg/m2 39.53 kg/m2 37.03 kg/m2

## 2015-11-25 ENCOUNTER — Ambulatory Visit: Payer: Managed Care, Other (non HMO) | Admitting: Family Medicine

## 2016-02-01 ENCOUNTER — Ambulatory Visit (INDEPENDENT_AMBULATORY_CARE_PROVIDER_SITE_OTHER): Payer: BLUE CROSS/BLUE SHIELD

## 2016-02-01 DIAGNOSIS — Z23 Encounter for immunization: Secondary | ICD-10-CM | POA: Diagnosis not present

## 2016-04-18 ENCOUNTER — Ambulatory Visit: Payer: Self-pay | Admitting: Family Medicine

## 2016-05-10 DIAGNOSIS — H1045 Other chronic allergic conjunctivitis: Secondary | ICD-10-CM | POA: Diagnosis not present

## 2016-05-16 ENCOUNTER — Ambulatory Visit (INDEPENDENT_AMBULATORY_CARE_PROVIDER_SITE_OTHER): Payer: BLUE CROSS/BLUE SHIELD | Admitting: Orthopedic Surgery

## 2016-05-16 ENCOUNTER — Ambulatory Visit (INDEPENDENT_AMBULATORY_CARE_PROVIDER_SITE_OTHER): Payer: BLUE CROSS/BLUE SHIELD

## 2016-05-16 ENCOUNTER — Encounter: Payer: Self-pay | Admitting: Orthopedic Surgery

## 2016-05-16 VITALS — BP 115/72 | HR 69 | Ht 63.0 in | Wt 220.0 lb

## 2016-05-16 DIAGNOSIS — M25532 Pain in left wrist: Secondary | ICD-10-CM

## 2016-05-16 DIAGNOSIS — M24132 Other articular cartilage disorders, left wrist: Secondary | ICD-10-CM

## 2016-05-16 MED ORDER — DICLOFENAC POTASSIUM 50 MG PO TABS: 50.0000 mg | ORAL_TABLET | Freq: Two times a day (BID) | ORAL | 3 refills | Status: DC

## 2016-05-16 NOTE — Progress Notes (Signed)
  NEW PROBLEM - OFFICE VISIT (LAST SEEN 2016)   Chief Complaint  Patient presents with  . Wrist Pain    left wrist pain    40 YO FEMALE PRESENTS WITH H/O ulnar-sided left wrist pain no history of trauma symptoms for 4 weeks no prior treatment. She basically has pain on the ulnar side of the wrist and hand and also on the dorsal radial side of the left wrist forearm.  Incidental pain with movement is intermittent it's mild to moderate    Review of Systems  Constitutional: Negative for fever.  Skin: Negative for rash.  Neurological: Negative for tingling.     Past Medical History:  Diagnosis Date  . Allergy    late Spring/early Summer  . Chronic constipation   . GERD (gastroesophageal reflux disease)   . Hx of varicella   . Hyperglycemia   . IBS (irritable bowel syndrome)     Past Surgical History:  Procedure Laterality Date  . BREAST REDUCTION SURGERY    . BREAST SURGERY Bilateral 2002   reduction  . bunnionectomy Left 2006  . right ankle    . TONSILLECTOMY  2004    Family History  Problem Relation Age of Onset  . Hypothyroidism Mother   . Hyperlipidemia Mother   . Cancer Father        lung  . Diabetes Father   . Hypertension Paternal Aunt   . Diabetes Paternal Aunt   . Diabetes Maternal Aunt   . Hypertension Sister    Social History  Substance Use Topics  . Smoking status: Never Smoker  . Smokeless tobacco: Never Used  . Alcohol use Yes     Comment: occ    Ht 5\' 3"  (1.6 m)   Wt 220 lb (99.8 kg)   BMI 38.97 kg/m   Physical Exam  Constitutional: She is oriented to person, place, and time. She appears well-developed and well-nourished.  Neurological: She is alert and oriented to person, place, and time.  Psychiatric: She has a normal mood and affect.  Vitals reviewed.   Ortho Exam  Right wrist examination inspection shows no deformities palpation reveals no tenderness. She has full range of motion of the wrist. Claudette LawsWatson test was negative wrist  shuck test was normal motor exam intact pulses and sensation were normal skin was intact  Left wrist and hand examination no tenderness over the ulnar or first extensor compartment no swelling she is tender over the base of the fifth and fourth CMC joints and triangular fibrocartilage. Skin is normal she has normal sensation good pulse and perfusion radial artery normal capillary refill motor exam intact good grip strength no instability and full range of motion. The Finkelstein's maneuver was normal No orders of the defined types were placed in this encounter.   Encounter Diagnoses  Name Primary?  . Acute wrist pain, left Yes  . Degenerative tear of triangular fibrocartilage complex (TFCC) of left wrist      PLAN:  NSAIDS ORAL VS TOPICAL AND SPLINT X 6 WEEKS   Meds ordered this encounter  Medications  . diclofenac (CATAFLAM) 50 MG tablet    Sig: Take 1 tablet (50 mg total) by mouth 2 (two) times daily.    Dispense:  90 tablet    Refill:  3

## 2016-05-16 NOTE — Patient Instructions (Signed)
WEAR WRIST SPLINT X 6 WEEKS  APPLY MAX FREEZE 2 X A DAY FOR 6 WEEKS   TAKE TWICE DAILY FOR 6 WEEKS

## 2016-06-09 ENCOUNTER — Ambulatory Visit: Payer: Self-pay | Admitting: Family Medicine

## 2016-06-27 ENCOUNTER — Ambulatory Visit: Payer: BLUE CROSS/BLUE SHIELD | Admitting: Orthopedic Surgery

## 2016-06-28 ENCOUNTER — Encounter: Payer: Self-pay | Admitting: Orthopedic Surgery

## 2016-07-28 ENCOUNTER — Ambulatory Visit: Payer: Self-pay | Admitting: Family Medicine

## 2016-07-28 ENCOUNTER — Telehealth: Payer: Self-pay | Admitting: Family Medicine

## 2016-07-28 NOTE — Telephone Encounter (Signed)
pls advise her she can call and order this herself , give her the number to call [phone], does not need a referral

## 2016-07-28 NOTE — Telephone Encounter (Signed)
New Message  Pt voiced needing order for mammogram.  Please f/u with pt

## 2016-07-29 NOTE — Telephone Encounter (Signed)
Patient aware.

## 2016-08-01 ENCOUNTER — Other Ambulatory Visit: Payer: Self-pay | Admitting: Family Medicine

## 2016-08-01 DIAGNOSIS — Z1231 Encounter for screening mammogram for malignant neoplasm of breast: Secondary | ICD-10-CM

## 2016-08-05 ENCOUNTER — Encounter (HOSPITAL_COMMUNITY): Payer: Self-pay

## 2016-08-05 ENCOUNTER — Ambulatory Visit (HOSPITAL_COMMUNITY)
Admission: RE | Admit: 2016-08-05 | Discharge: 2016-08-05 | Disposition: A | Discharge status: Home / Self Care | Payer: BLUE CROSS/BLUE SHIELD | Source: Ambulatory Visit | Attending: Family Medicine | Admitting: Family Medicine

## 2016-08-05 DIAGNOSIS — Z1231 Encounter for screening mammogram for malignant neoplasm of breast: Secondary | ICD-10-CM | POA: Diagnosis not present

## 2017-02-07 DIAGNOSIS — Z01419 Encounter for gynecological examination (general) (routine) without abnormal findings: Secondary | ICD-10-CM | POA: Diagnosis not present

## 2017-02-07 DIAGNOSIS — Z114 Encounter for screening for human immunodeficiency virus [HIV]: Secondary | ICD-10-CM | POA: Diagnosis not present

## 2017-02-07 DIAGNOSIS — Z6841 Body Mass Index (BMI) 40.0 and over, adult: Secondary | ICD-10-CM | POA: Diagnosis not present

## 2017-02-07 DIAGNOSIS — Z118 Encounter for screening for other infectious and parasitic diseases: Secondary | ICD-10-CM | POA: Diagnosis not present

## 2017-02-07 DIAGNOSIS — Z1151 Encounter for screening for human papillomavirus (HPV): Secondary | ICD-10-CM | POA: Diagnosis not present

## 2017-02-07 DIAGNOSIS — Z1159 Encounter for screening for other viral diseases: Secondary | ICD-10-CM | POA: Diagnosis not present

## 2017-02-07 DIAGNOSIS — Z113 Encounter for screening for infections with a predominantly sexual mode of transmission: Secondary | ICD-10-CM | POA: Diagnosis not present

## 2017-02-08 ENCOUNTER — Ambulatory Visit: Payer: BLUE CROSS/BLUE SHIELD

## 2017-02-09 ENCOUNTER — Ambulatory Visit (INDEPENDENT_AMBULATORY_CARE_PROVIDER_SITE_OTHER): Payer: BLUE CROSS/BLUE SHIELD

## 2017-02-09 DIAGNOSIS — Z23 Encounter for immunization: Secondary | ICD-10-CM

## 2017-04-26 ENCOUNTER — Encounter: Payer: Self-pay | Admitting: Family Medicine

## 2017-04-26 ENCOUNTER — Ambulatory Visit: Payer: BLUE CROSS/BLUE SHIELD | Admitting: Family Medicine

## 2017-04-26 VITALS — BP 118/80 | HR 91 | Resp 16 | Ht 63.0 in | Wt 223.0 lb

## 2017-04-26 DIAGNOSIS — L0291 Cutaneous abscess, unspecified: Secondary | ICD-10-CM

## 2017-04-26 DIAGNOSIS — E559 Vitamin D deficiency, unspecified: Secondary | ICD-10-CM | POA: Diagnosis not present

## 2017-04-26 DIAGNOSIS — R7302 Impaired glucose tolerance (oral): Secondary | ICD-10-CM

## 2017-04-26 DIAGNOSIS — I889 Nonspecific lymphadenitis, unspecified: Secondary | ICD-10-CM | POA: Diagnosis not present

## 2017-04-26 MED ORDER — FLUCONAZOLE 150 MG PO TABS: ORAL_TABLET | ORAL | 0 refills | Status: DC

## 2017-04-26 MED ORDER — CEPHALEXIN 500 MG PO CAPS: 500.0000 mg | ORAL_CAPSULE | Freq: Four times a day (QID) | ORAL | 0 refills | Status: DC

## 2017-04-26 NOTE — Patient Instructions (Addendum)
FU in 6 months, call if you need me before  Fasting labs asap, CBC, lipid, cmp and EGFR, TSH, hBA1C and vit D labs will be sent to My Chart  Use claritin every day for allergies and when excess drainage use the  sudafed   Weight loss goal of 15 pound is very reasonable you can do this Antibiotic sent in to treat skin infection  Please work on good  health habits so that your health will improve. 1. Commitment to daily physical activity for 30 to 60  minutes, if you are able to do this.  2. Commitment to wise food choices. Aim for half of your  food intake to be vegetable and fruit, one quarter starchy foods, and one quarter protein. Try to eat on a regular schedule  3 meals per day, snacking between meals should be limited to vegetables or fruits or small portions of nuts. 64 ounces of water per day is generally recommended, unless you have specific health conditions, like heart failure or kidney failure where you will need to limit fluid intake.  3. Commitment to sufficient and a  good quality of physical and mental rest daily, generally between 6 to 8 hours per day.  WITH PERSISTANCE AND PERSEVERANCE, THE IMPOSSIBLE , BECOMES THE NORM!   It is important that you exercise regularly at least 30 minutes 5 times a week. If you develop chest pain, have severe difficulty breathing, or feel very tired, stop exercising immediately and seek medical attention    My Fitness pal, weight watchers and mare 2 excellent resources

## 2017-05-01 ENCOUNTER — Encounter: Payer: Self-pay | Admitting: Family Medicine

## 2017-05-01 DIAGNOSIS — L0291 Cutaneous abscess, unspecified: Secondary | ICD-10-CM | POA: Insufficient documentation

## 2017-05-01 NOTE — Assessment & Plan Note (Signed)
Keflex prescribed.

## 2017-05-01 NOTE — Progress Notes (Signed)
   [name]     MRN: [id number]      DOB: [birth year]   HPI [name] is here with a  2 day h/o painful bump on the right inner thigh and swollen glands on the right inner groin. She denies any vaginal discharge and is currently on her menses. Also concerned about weight gain and states she is now ready to get seriously to work on what she needs to do to lose weight on a consistent basis, she does not want to take an appetite suppressant  ROS Denies recent fever or chills. Denies sinus pressure, nasal congestion, ear pain or sore throat. Denies chest congestion, productive cough or wheezing. Denies chest pains, palpitations and leg swelling Denies abdominal pain, nausea, vomiting,diarrhea or constipation.   Denies dysuria, frequency, hesitancy or incontinence. Denies joint pain, swelling and limitation in mobility. Denies headaches, seizures, numbness, or tingling. Denies depression, anxiety or insomnia.    PE  BP 118/80   Pulse 91   Resp 16   Ht  (1.6 m)   Wt 223 lb (101.2 kg)   SpO2 99%   BMI 39.50 kg/m   Patient alert and oriented and in no cardiopulmonary distress.  HEENT: No facial asymmetry, EOMI,   oropharynx pink and moist.  Neck supple no JVD, no mass.  Chest: Clear to auscultation bilaterally.  CVS: S1, S2 no murmurs, no S3.Regular rate.  ABD: Soft non tender.   Ext: No edema  MS: Adequate ROM spine, shoulders, hips and knees.  Skin: abscess dime sized on inner right thigh , not draining and right inguinal adenopathy  Psych: Good eye contact, normal affect. Memory intact not anxious or depressed appearing.  CNS: CN 2-12 intact, power,  normal throughout.no focal deficits noted.   Assessment & Plan  Abscess Right inner thigh, dime sized, keflex and fluconazole prescribed  Morbid obesity Deteriorated. Patient re-educated about  the importance of commitment to a  minimum of 150 minutes of exercise per week.  The importance of  healthy food choices with portion control discussed. Encouraged to start a food diary, count calories and to consider  joining a support group. Sample diet sheets offered. Goals set by the patient for the next several months.   Weight /BMI 04/26/2017 05/16/2016 11/17/2015  WEIGHT 223 lb 220 lb 220 lb  HEIGHT     BMI 39.5 kg/m2 38.97 kg/m2 38.97 kg/m2      Adenitis Keflex prescribed

## 2017-05-01 NOTE — Assessment & Plan Note (Signed)
Right inner thigh, dime sized, keflex and fluconazole prescribed

## 2017-05-01 NOTE — Assessment & Plan Note (Signed)
Deteriorated. Patient re-educated about  the importance of commitment to a  minimum of 150 minutes of exercise per week.  The importance of healthy food choices with portion control discussed. Encouraged to start a food diary, count calories and to consider  joining a support group. Sample diet sheets offered. Goals set by the patient for the next several months.   Weight /BMI 04/26/2017 05/16/2016 11/17/2015  WEIGHT 223 lb 220 lb 220 lb  HEIGHT     BMI 39.5 kg/m2 38.97 kg/m2 38.97 kg/m2

## 2017-05-04 ENCOUNTER — Ambulatory Visit: Payer: BLUE CROSS/BLUE SHIELD | Admitting: Family Medicine

## 2017-05-21 ENCOUNTER — Encounter: Payer: Self-pay | Admitting: Family Medicine

## 2017-07-31 ENCOUNTER — Other Ambulatory Visit: Payer: Self-pay | Admitting: Family Medicine

## 2017-07-31 DIAGNOSIS — Z1231 Encounter for screening mammogram for malignant neoplasm of breast: Secondary | ICD-10-CM

## 2017-08-02 DIAGNOSIS — R7302 Impaired glucose tolerance (oral): Secondary | ICD-10-CM | POA: Diagnosis not present

## 2017-08-02 DIAGNOSIS — E559 Vitamin D deficiency, unspecified: Secondary | ICD-10-CM | POA: Diagnosis not present

## 2017-08-03 ENCOUNTER — Other Ambulatory Visit: Payer: Self-pay | Admitting: Family Medicine

## 2017-08-03 LAB — LIPID PANEL
CHOLESTEROL: 142 mg/dL (ref <200)
HDL: 55 mg/dL (ref >50)
LDL Cholesterol (Calc): 73 mg/dL (calc)
Non-HDL Cholesterol (Calc): 87 mg/dL (calc) (ref <130)
TRIGLYCERIDES: 65 mg/dL (ref <150)
Total CHOL/HDL Ratio: 2.6 (calc) (ref <5.0)

## 2017-08-03 LAB — COMPLETE METABOLIC PANEL WITH GFR
AG Ratio: 1.4 (calc) (ref 1.0–2.5)
ALT: 7 U/L (ref 6–29)
AST: 14 U/L (ref 10–30)
Albumin: 4.2 g/dL (ref 3.6–5.1)
Alkaline phosphatase (APISO): 57 U/L (ref 33–115)
BUN / CREAT RATIO: 6 (calc) (ref 6–22)
BUN: 5 mg/dL — AB (ref 7–25)
CALCIUM: 9.4 mg/dL (ref 8.6–10.2)
CO2: 23 mmol/L (ref 20–32)
CREATININE: 0.87 mg/dL (ref 0.50–1.10)
Chloride: 105 mmol/L (ref 98–110)
GFR, EST AFRICAN AMERICAN: 95 mL/min/{1.73_m2} (ref >=60)
GFR, Est Non African American: 82 mL/min/{1.73_m2} (ref >=60)
GLUCOSE: 95 mg/dL (ref 65–99)
Globulin: 3 g/dL (calc) (ref 1.9–3.7)
Potassium: 4.9 mmol/L (ref 3.5–5.3)
Sodium: 140 mmol/L (ref 135–146)
TOTAL PROTEIN: 7.2 g/dL (ref 6.1–8.1)
Total Bilirubin: 0.7 mg/dL (ref 0.2–1.2)

## 2017-08-03 LAB — CBC
HEMATOCRIT: 39.5 % (ref 35.0–45.0)
Hemoglobin: 12.7 g/dL (ref 11.7–15.5)
MCH: 26.8 pg — ABNORMAL LOW (ref 27.0–33.0)
MCHC: 32.2 g/dL (ref 32.0–36.0)
MCV: 83.3 fL (ref 80.0–100.0)
MPV: 11.5 fL (ref 7.5–12.5)
Platelets: 406 10*3/uL — ABNORMAL HIGH (ref 140–400)
RBC: 4.74 10*6/uL (ref 3.80–5.10)
RDW: 13.1 % (ref 11.0–15.0)
WBC: 7.2 10*3/uL (ref 3.8–10.8)

## 2017-08-03 LAB — HEMOGLOBIN A1C
Hgb A1c MFr Bld: 5.5 % of total Hgb (ref <5.7)
Mean Plasma Glucose: 111 (calc)
eAG (mmol/L): 6.2 (calc)

## 2017-08-03 LAB — VITAMIN D 25 HYDROXY (VIT D DEFICIENCY, FRACTURES): Vit D, 25-Hydroxy: 21 ng/mL — ABNORMAL LOW (ref 30–100)

## 2017-08-03 LAB — TSH: TSH: 1.45 mIU/L

## 2017-08-03 MED ORDER — ERGOCALCIFEROL 1.25 MG (50000 UT) PO CAPS: 50000.0000 [IU] | ORAL_CAPSULE | ORAL | 1 refills | Status: DC

## 2017-08-03 NOTE — Progress Notes (Signed)
Vit d 

## 2017-08-07 ENCOUNTER — Ambulatory Visit (HOSPITAL_COMMUNITY)
Admission: RE | Admit: 2017-08-07 | Discharge: 2017-08-07 | Disposition: A | Discharge status: Home / Self Care | Payer: BLUE CROSS/BLUE SHIELD | Source: Ambulatory Visit | Attending: Family Medicine | Admitting: Family Medicine

## 2017-08-07 DIAGNOSIS — Z1231 Encounter for screening mammogram for malignant neoplasm of breast: Secondary | ICD-10-CM

## 2017-08-15 ENCOUNTER — Ambulatory Visit (INDEPENDENT_AMBULATORY_CARE_PROVIDER_SITE_OTHER): Payer: BLUE CROSS/BLUE SHIELD | Admitting: Family Medicine

## 2017-08-15 ENCOUNTER — Encounter: Payer: Self-pay | Admitting: Family Medicine

## 2017-08-15 VITALS — BP 104/74 | HR 66 | Resp 16 | Ht 63.0 in | Wt 214.0 lb

## 2017-08-15 DIAGNOSIS — E559 Vitamin D deficiency, unspecified: Secondary | ICD-10-CM

## 2017-08-15 DIAGNOSIS — R682 Dry mouth, unspecified: Secondary | ICD-10-CM | POA: Diagnosis not present

## 2017-08-15 NOTE — Patient Instructions (Signed)
Please change October f/u to end December   Call if you need me before'   Congrats on weight loss, goal of 2.5 to 3 pounds per month   Please stop current lip preps, may  use plain vaseline and get dental opinion also    Continue vit d weekly and start 3 times weekly iron 325 mg OTC

## 2017-08-19 ENCOUNTER — Encounter: Payer: Self-pay | Admitting: Family Medicine

## 2017-08-19 DIAGNOSIS — R682 Dry mouth, unspecified: Secondary | ICD-10-CM | POA: Insufficient documentation

## 2017-08-19 NOTE — Assessment & Plan Note (Signed)
No current flare 

## 2017-08-19 NOTE — Assessment & Plan Note (Addendum)
Not repleted , she is opting to take prescription vit D

## 2017-08-19 NOTE — Assessment & Plan Note (Signed)
Pt is advised to discontinue use of all cosmetics and lip balms and to use pure  Vaseline instead, she is to have her dentis evaluate AS WELL, LAB DATA IS NORMAL the oral exam is normal

## 2017-08-19 NOTE — Assessment & Plan Note (Signed)
Improved Patient re-educated about  the importance of commitment to a  minimum of 150 minutes of exercise per week.  The importance of healthy food choices with portion control discussed. Encouraged to start a food diary, count calories and to consider  joining a support group. Sample diet sheets offered. Goals set by the patient for the next several months.   Weight /BMI 08/15/2017 04/26/2017 05/16/2016  WEIGHT 214 lb 223 lb 220 lb  HEIGHT 5\' 3"  5\' 3"  5\' 3"   BMI 37.91 kg/m2 39.5 kg/m2 38.97 kg/m2

## 2017-08-19 NOTE — Progress Notes (Signed)
   Susan RompShelitha P Jackson     MRN: [id number]      DOB: 02/19/1975   HPI [name] is here for follow up and re-evaluation of chronic medical conditions, medication management and review of any available recent lab and radiology data.  Preventive health is updated, specifically  Cancer screening and Immunization.    Has worked on lifestyle change with weight loss success and is encouraged by this C/o 6 weeks of dryness  Of lower inner lip, denies any change in oral preps as is toothpaste or cosmetics.   ROS Denies recent fever or chills. Denies sinus pressure, nasal congestion, ear pain or sore throat. Denies chest congestion, productive cough or wheezing. Denies chest pains, palpitations and leg swelling Denies abdominal pain, nausea, vomiting,diarrhea or constipation.   Denies dysuria, frequency, hesitancy or incontinence. Denies joint pain, swelling and limitation in mobility. Denies headaches, seizures, numbness, or tingling. Denies depression, anxiety or insomnia.   PE  BP 104/74   Pulse 66   Resp 16   Ht 5\' 3"  (1.6 m)   Wt 214 lb (97.1 kg)   SpO2 100%   BMI 37.91 kg/m   Patient alert and oriented and in no cardiopulmonary distress.  HEENT: No facial asymmetry, EOMI,   oropharynx pink and moist.  Neck supple no JVD, no mass.  Chest: Clear to auscultation bilaterally.  CVS: S1, S2 no murmurs, no S3.Regular rate.  ABD: Soft non tender.   Ext: No edema  MS: Adequate ROM spine, shoulders, hips and knees.  Skin: Intact, no ulcerations or rash noted.  Psych: Good eye contact, normal affect. Memory intact not anxious or depressed appearing.  CNS: CN 2-12 intact, power,  normal throughout.no focal deficits noted.   Assessment & Plan Mouth dryness Pt is advised to discontinue use of all cosmetics and lip balms and to use pure  Vaseline instead, she is to have her dentis evaluate AS WELL, LAB DATA IS NORMAL the oral exam is normal  Morbid obesity Improved Patient  re-educated about  the importance of commitment to a  minimum of 150 minutes of exercise per week.  The importance of healthy food choices with portion control discussed. Encouraged to start a food diary, count calories and to consider  joining a support group. Sample diet sheets offered. Goals set by the patient for the next several months.   Weight /BMI 08/15/2017 04/26/2017 05/16/2016  WEIGHT 214 lb 223 lb 220 lb  HEIGHT 5\' 3"  5\' 3"  5\' 3"   BMI 37.91 kg/m2 39.5 kg/m2 38.97 kg/m2      Vitamin D deficiency Not repleted , she is opting to take prescription vit D  Eczema No current flare

## 2017-09-06 ENCOUNTER — Telehealth: Payer: Self-pay | Admitting: Family Medicine

## 2017-09-06 NOTE — Telephone Encounter (Signed)
Please explain whiat this is about , get message clarified, don't know the infection noted

## 2017-09-06 NOTE — Telephone Encounter (Signed)
Daughter has stran a and b and peds told her to call us for medication for her and her [name] dob 06-28-1949 as prevention.  Please let her know what your are calling in .

## 2017-09-07 ENCOUNTER — Other Ambulatory Visit: Payer: Self-pay | Admitting: Family Medicine

## 2017-09-07 MED ORDER — OSELTAMIVIR PHOSPHATE 75 MG PO CAPS: 75.0000 mg | ORAL_CAPSULE | Freq: Two times a day (BID) | ORAL | 0 refills | Status: DC

## 2017-09-07 NOTE — Telephone Encounter (Signed)
Pt aware.

## 2017-09-07 NOTE — Telephone Encounter (Signed)
Med is sent, she only takes IF she develops flu seymptoms. She needs to come and get the flu vaccine

## 2017-09-07 NOTE — Telephone Encounter (Signed)
Daughter was diagnosed with the flu and she was told to call her PCP for tamiflu for preventative

## 2017-09-07 NOTE — Telephone Encounter (Signed)
Patient called in to check the status of her message from yesterday. Her daughter has the flu and she is asking for a preventative medication.

## 2017-10-25 ENCOUNTER — Ambulatory Visit: Payer: BLUE CROSS/BLUE SHIELD | Admitting: Family Medicine

## 2017-10-27 ENCOUNTER — Telehealth: Payer: Self-pay | Admitting: Family Medicine

## 2017-10-27 NOTE — Telephone Encounter (Signed)
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

## 2017-10-31 MED ORDER — OSELTAMIVIR PHOSPHATE 75 MG PO CAPS: 75.0000 mg | ORAL_CAPSULE | Freq: Two times a day (BID) | ORAL | 0 refills | Status: DC

## 2017-10-31 NOTE — Telephone Encounter (Signed)
Called pt to advise that Tamiflu is called into the pharmacy.

## 2017-10-31 NOTE — Telephone Encounter (Signed)
pls send tamiflu 75 mg one twice daily for 5 days and let her know

## 2017-10-31 NOTE — Telephone Encounter (Signed)
Did we ever get an answer on this?

## 2017-10-31 NOTE — Addendum Note (Signed)
Addended by: Abner Greenspan on: 10/31/2017 04:58 PM   Modules accepted: Orders

## 2017-11-01 ENCOUNTER — Encounter: Payer: Self-pay | Admitting: Family Medicine

## 2017-11-01 ENCOUNTER — Ambulatory Visit (INDEPENDENT_AMBULATORY_CARE_PROVIDER_SITE_OTHER): Payer: BLUE CROSS/BLUE SHIELD | Admitting: Family Medicine

## 2017-11-01 VITALS — BP 108/74 | HR 87 | Temp 98.9°F | Resp 15 | Ht 63.0 in | Wt 214.0 lb

## 2017-11-01 DIAGNOSIS — R05 Cough: Secondary | ICD-10-CM

## 2017-11-01 DIAGNOSIS — R059 Cough, unspecified: Secondary | ICD-10-CM

## 2017-11-01 DIAGNOSIS — J0100 Acute maxillary sinusitis, unspecified: Secondary | ICD-10-CM | POA: Diagnosis not present

## 2017-11-01 MED ORDER — FLUCONAZOLE 150 MG PO TABS: ORAL_TABLET | ORAL | 0 refills | Status: DC

## 2017-11-01 MED ORDER — BENZONATATE 100 MG PO CAPS: 100.0000 mg | ORAL_CAPSULE | Freq: Two times a day (BID) | ORAL | 0 refills | Status: DC | PRN

## 2017-11-01 MED ORDER — SULFAMETHOXAZOLE-TRIMETHOPRIM 800-160 MG PO TABS: 1.0000 | ORAL_TABLET | Freq: Two times a day (BID) | ORAL | 0 refills | Status: DC

## 2017-11-01 NOTE — Progress Notes (Signed)
   [name]     MRN: [id number]      DOB: [birth year]   HPI [name] is herewith a 6 day h/o sinus pressure over left cheek and ethmoid area and also frontal headache. Drainage is thick and green, symptoms are causing a headache, she has some ear fullness and intermittent cough Positive contact with influenza has question as to whether she has influenza, by symptoms and signs , NO   ROS See HPI . Denies chest congestion, productive cough or wheezing. Denies chest pains, palpitations and leg swelling Denies abdominal pain, nausea, vomiting,diarrhea or constipation.   Denies dysuria, frequency, hesitancy or incontinence. Denies joint pain, swelling and limitation in mobility. Denies headaches, seizures, numbness, or tingling. Denies depression, anxiety or insomnia. Denies skin break down or rash.   PE  BP 108/74   Pulse 87   Temp 98.9 F (37.2 C) (Temporal)   Resp 15   Ht 5\' 3"  (1.6 m)   Wt 214 lb (97.1 kg)   SpO2 99%   BMI 37.91 kg/m   Patient alert and oriented and in no cardiopulmonary distress.  HEENT: No facial asymmetry, EOMI,   oropharynx pink and moist.  Neck supple no JVD, no mass.left maxillary sinus tenderness, tM clear  Chest: Clear to auscultation bilaterally.  CVS: S1, S2 no murmurs, no S3.Regular rate.  ABD: Soft non tender.   Ext: No edema  MS: Adequate ROM spine, shoulders, hips and knees.  Skin: Intact, no ulcerations or rash noted.  Psych: Good eye contact, normal affect. Memory intact not anxious or depressed appearing.  CNS: CN 2-12 intact, power,  normal throughout.no focal deficits noted.   Assessment & Plan  Maxillary sinusitis, acute Septra x 1 0 days and diflucan asn needed, call if persists for x ray  Cough Tessalon perle as needed  Morbid obesity Unchanged Patient re-educated about  the importance of commitment to a  minimum of 150 minutes of exercise per week.  The importance of healthy food choices with portion  control discussed. Encouraged to start a food diary, count calories and to consider  joining a support group. Sample diet sheets offered. Goals set by the patient for the next several months.   Weight /BMI 11/01/2017 08/15/2017 04/26/2017  WEIGHT 214 lb 214 lb 223 lb  HEIGHT 5\' 3"  5\' 3"  5\' 3"   BMI 37.91 kg/m2 37.91 kg/m2 39.5 kg/m2

## 2017-11-01 NOTE — Addendum Note (Signed)
Addended by: Galen Daft A on: 11/01/2017 10:03 AM   Modules accepted: Orders

## 2017-11-01 NOTE — Assessment & Plan Note (Signed)
Septra x 1 0 days and diflucan asn needed, call if persists for x ray

## 2017-11-01 NOTE — Assessment & Plan Note (Signed)
Unchanged Patient re-educated about  the importance of commitment to a  minimum of 150 minutes of exercise per week.  The importance of healthy food choices with portion control discussed. Encouraged to start a food diary, count calories and to consider  joining a support group. Sample diet sheets offered. Goals set by the patient for the next several months.   Weight /BMI 11/01/2017 08/15/2017 04/26/2017  WEIGHT 214 lb 214 lb 223 lb  HEIGHT 5\' 3"  5\' 3"  5\' 3"   BMI 37.91 kg/m2 37.91 kg/m2 39.5 kg/m2

## 2017-11-01 NOTE — Assessment & Plan Note (Signed)
Tessalon perle as needed

## 2017-11-01 NOTE — Patient Instructions (Addendum)
F/u as before, call if you need me sooner  You have no symptoms of influenza,  And you are treated for acute sinusitis.  Fort headache related to sinuses , OK to use tylenol  Take entire antibiotic course , please get in touch idf symptoms persist or worsen, I will order Xray of sinus  Medication is sent for as needed use for the cough  Medication is at [facility]    Sinusitis, Adult Sinusitis is soreness and inflammation of your sinuses. Sinuses are hollow spaces in the bones around your face. They are located:  Around your eyes.  In the middle of your forehead.  Behind your nose.  In your cheekbones.  Your sinuses and nasal passages are lined with a stringy fluid (mucus). Mucus normally drains out of your sinuses. When your nasal tissues get inflamed or swollen, the mucus can get trapped or blocked so air cannot flow through your sinuses. This lets bacteria, viruses, and funguses grow, and that leads to infection. Follow these instructions at home: Medicines  Take, use, or apply over-the-counter and prescription medicines only as told by your doctor. These may include nasal sprays.  If you were prescribed an antibiotic medicine, take it as told by your doctor. Do not stop taking the antibiotic even if you start to feel better. Hydrate and Humidify  Drink enough water to keep your pee (urine) clear or pale yellow.  Use a cool mist humidifier to keep the humidity level in your home above 50%.  Breathe in steam for 10-15 minutes, 3-4 times a day or as told by your doctor. You can do this in the bathroom while a hot shower is running.  Try not to spend time in cool or dry air. Rest  Rest as much as possible.  Sleep with your head raised (elevated).  Make sure to get enough sleep each night. General instructions  Put a warm, moist washcloth on your face 3-4 times a day or as told by your doctor. This will help with discomfort.  Wash your hands often with soap and water.  If there is no soap and water, use hand sanitizer.  Do not smoke. Avoid being around people who are smoking (secondhand smoke).  Keep all follow-up visits as told by your doctor. This is important. Contact a doctor if:  You have a fever.  Your symptoms get worse.  Your symptoms do not get better within 10 days. Get help right away if:  You have a very bad headache.  You cannot stop throwing up (vomiting).  You have pain or swelling around your face or eyes.  You have trouble seeing.  You feel confused.  Your neck is stiff.  You have trouble breathing. This information is not intended to replace advice given to you by your health care provider. Make sure you discuss any questions you have with your health care provider. Document Released: 06/08/2007 Document Revised: 08/16/2015 Document Reviewed: 10/15/2014 Elsevier Interactive Patient Education  [employer].

## 2017-12-13 ENCOUNTER — Encounter: Payer: Self-pay | Admitting: Family Medicine

## 2017-12-13 ENCOUNTER — Ambulatory Visit (INDEPENDENT_AMBULATORY_CARE_PROVIDER_SITE_OTHER): Payer: BLUE CROSS/BLUE SHIELD | Admitting: Family Medicine

## 2017-12-13 ENCOUNTER — Ambulatory Visit (HOSPITAL_COMMUNITY)
Admission: RE | Admit: 2017-12-13 | Discharge: 2017-12-13 | Disposition: A | Discharge status: Home / Self Care | Payer: BLUE CROSS/BLUE SHIELD | Source: Ambulatory Visit | Attending: Family Medicine | Admitting: Family Medicine

## 2017-12-13 VITALS — BP 110/72 | HR 68 | Resp 12 | Ht 63.0 in | Wt 214.1 lb

## 2017-12-13 DIAGNOSIS — M269 Dentofacial anomaly, unspecified: Secondary | ICD-10-CM

## 2017-12-13 DIAGNOSIS — Z23 Encounter for immunization: Secondary | ICD-10-CM | POA: Diagnosis not present

## 2017-12-13 DIAGNOSIS — R220 Localized swelling, mass and lump, head: Secondary | ICD-10-CM | POA: Diagnosis not present

## 2017-12-13 NOTE — Patient Instructions (Signed)
F/U in 4 months, call if you need me before    X ray of left jaw in area of concern, if normal will refer you to surgeon  Weight loss goal of 10 pounds  Flu vaccine today   It is important that you exercise regularly at least 40 minutes 5 times a week. If you develop chest pain, have severe difficulty breathing, or feel very tired, stop exercising immediately and seek medical attention

## 2017-12-13 NOTE — Progress Notes (Signed)
   Brita RompShelitha P Trautman     MRN: [id number]      DOB: 04/04/1975   HPI Ms. Weston Annallington is here for follow up and re-evaluation of chronic medical conditions, medication management and review of any available recent lab and radiology data.  Preventive health is updated, specifically  Cancer screening and Immunization.   The PT denies any adverse reactions to current medications since the last visit.  "knot" on right jaw , first noted in Jan 2019, non tender , concern re cancer  Vocalized Has not been consistent with lifestyle change , hence no change in weight as planned, however , ready to re engage    ROS Denies recent fever or chills. Denies sinus pressure, nasal congestion, ear pain or sore throat. Denies chest congestion, productive cough or wheezing. Denies chest pains, palpitations and leg swelling Denies abdominal pain, nausea, vomiting,diarrhea or constipation.   Denies dysuria, frequency, hesitancy or incontinence. Denies joint pain, swelling and limitation in mobility. Denies headaches, seizures, numbness, or tingling. Denies depression, anxiety or insomnia.    PE  BP 110/72 (BP Location: Left Arm, Patient Position: Sitting, Cuff Size: Large)   Pulse 68   Resp 12   Ht 5\' 3"  (1.6 m)   Wt 214 lb 1.9 oz (97.1 kg)   SpO2 99% Comment: room air  BMI 37.93 kg/m   Patient alert and oriented and in no cardiopulmonary distress.  HEENT: No facial asymmetry, EOMI,   oropharynx pink and moist.  Neck supple no JVD, no mass.  Chest: Clear to auscultation bilaterally.  CVS: S1, S2 no murmurs, no S3.Regular rate.  ABD: Soft non tender.   Ext: No edema  MS: Adequate ROM spine, shoulders, hips and knees.  Skin: Intact, no ulcerations or rash noted.noidule palpable anterior to right ear, not warm or red, non tender, pea sized  Psych: Good eye contact, normal affect. Memory intact not anxious or depressed appearing.  CNS: CN 2-12 intact, power,  normal throughout.no focal  deficits noted.   Assessment & Plan  Jaw anomaly One  Year h/o growth/ mass in front of right ear on jaw, non tender increasing in size, clinical impression is that this is a nodule, doubt cancer , but patient concerned. Will obtain X ray of jaw, if no bony abnormality, the surgery to eval  Morbid obesity Unchanged Patient re-educated about  the importance of commitment to a  minimum of 150 minutes of exercise per week.  The importance of healthy food choices with portion control discussed. Encouraged to start a food diary, count calories and to consider  joining a support group. Sample diet sheets offered. Goals set by the patient for the next several months.   Weight /BMI 12/13/2017 11/01/2017 08/15/2017  WEIGHT 214 lb 1.9 oz 214 lb 214 lb  HEIGHT 5\' 3"  5\' 3"  5\' 3"   BMI 37.93 kg/m2 37.91 kg/m2 37.91 kg/m2

## 2017-12-15 ENCOUNTER — Encounter: Payer: Self-pay | Admitting: Family Medicine

## 2017-12-17 ENCOUNTER — Other Ambulatory Visit: Payer: Self-pay | Admitting: Family Medicine

## 2017-12-17 ENCOUNTER — Encounter: Payer: Self-pay | Admitting: Family Medicine

## 2017-12-17 DIAGNOSIS — R220 Localized swelling, mass and lump, head: Principal | ICD-10-CM

## 2017-12-17 DIAGNOSIS — M278 Other specified diseases of jaws: Secondary | ICD-10-CM

## 2017-12-17 NOTE — Assessment & Plan Note (Signed)
One  Year h/o growth/ mass in front of right ear on jaw, non tender increasing in size, clinical impression is that this is a nodule, doubt cancer , but patient concerned. Will obtain X ray of jaw, if no bony abnormality, the surgery to eval

## 2017-12-17 NOTE — Assessment & Plan Note (Signed)
Unchanged Patient re-educated about  the importance of commitment to a  minimum of 150 minutes of exercise per week.  The importance of healthy food choices with portion control discussed. Encouraged to start a food diary, count calories and to consider  joining a support group. Sample diet sheets offered. Goals set by the patient for the next several months.   Weight /BMI 12/13/2017 11/01/2017 08/15/2017  WEIGHT 214 lb 1.9 oz 214 lb 214 lb  HEIGHT 5\' 3"  5\' 3"  5\' 3"   BMI 37.93 kg/m2 37.91 kg/m2 37.91 kg/m2

## 2018-01-01 ENCOUNTER — Ambulatory Visit: Payer: BLUE CROSS/BLUE SHIELD | Admitting: Family Medicine

## 2018-02-23 ENCOUNTER — Telehealth: Payer: Self-pay | Admitting: *Deleted

## 2018-02-23 NOTE — Telephone Encounter (Signed)
Pt called stating she had sinus infection symptoms. She is already taking pseudaphed d but she is coughing up brown mucous. Wanted to know if something could be called in for the infection. She has congestion in her head not so much in her chest. She had a fever earlier in the week but has not had one in the last couple of days. Offered her an appointment stated she had just gotten a new job and could not afford to be off to come in.

## 2018-02-23 NOTE — Telephone Encounter (Signed)
Made patient aware of Dr.Simpson's response.

## 2018-02-23 NOTE — Telephone Encounter (Signed)
I understand if unable to leave a new job. Based on symptoms , I recommend urgent care visit after work for evaluation and appropriate treatment

## 2018-02-23 NOTE — Telephone Encounter (Signed)
Please advise 

## 2018-04-17 ENCOUNTER — Ambulatory Visit: Payer: BLUE CROSS/BLUE SHIELD | Admitting: Family Medicine

## 2018-04-27 ENCOUNTER — Encounter: Payer: Self-pay | Admitting: Family Medicine

## 2018-04-27 ENCOUNTER — Other Ambulatory Visit: Payer: Self-pay

## 2018-04-27 ENCOUNTER — Ambulatory Visit (INDEPENDENT_AMBULATORY_CARE_PROVIDER_SITE_OTHER): Payer: BLUE CROSS/BLUE SHIELD | Admitting: Family Medicine

## 2018-04-27 VITALS — Ht 63.0 in | Wt 207.0 lb

## 2018-04-27 DIAGNOSIS — R7302 Impaired glucose tolerance (oral): Secondary | ICD-10-CM

## 2018-04-27 DIAGNOSIS — M269 Dentofacial anomaly, unspecified: Secondary | ICD-10-CM | POA: Diagnosis not present

## 2018-04-27 DIAGNOSIS — Z6836 Body mass index (BMI) 36.0-36.9, adult: Secondary | ICD-10-CM

## 2018-04-27 DIAGNOSIS — E559 Vitamin D deficiency, unspecified: Secondary | ICD-10-CM

## 2018-04-27 DIAGNOSIS — E6609 Other obesity due to excess calories: Secondary | ICD-10-CM

## 2018-04-27 NOTE — Progress Notes (Signed)
Virtual Visit via Video Note  I connected with Dannisha P Gutzmer on 04/27/18 at 10:20 AM EDT by a video enabled telemedicine application and verified that I am speaking with the correct person using two identifiers.   I discussed the limitations of evaluation and management by telemedicine and the availability of in person appointments. The patient expressed understanding and agreed to proceed.  Location of Patient: Home Location of Provider: Telehealth Consent was obtain for visit to be over via telehealth.  History of Present Illness: Ms. Lishman is a 43 year old female patient of Dr. Anthony Sar.  Who has not been seen in several months, and called the office and was placed on schedule for follow-up.  Overall she is doing well and without complaints today on WebEx.  Current medications include Flonase, and MiraLAX as needed.  History of GERD, impaired glucose tolerance, eczema, obesity, vitamin D deficiency, jaw abnormality.  Overall reports she is doing well.  She is still having some weight issues.  Would like to lose more weight.  She is 89 per her today.  She is due for her annual exam in a couple of months.  Has some questions secondary to vitamin use.  Past Medical, Surgical, Social History, Allergies, and Medications have been Reviewed.  Review of Systems  Constitutional: Negative for chills and fever.  HENT: Negative.  Negative for rhinorrhea, sinus pressure, sinus pain and sneezing.   Eyes: Negative.   Respiratory: Negative.  Negative for cough, chest tightness, shortness of breath and wheezing.   Cardiovascular: Negative.  Negative for chest pain, palpitations and leg swelling.  Gastrointestinal: Negative.   Endocrine: Negative.   Genitourinary: Negative.   Musculoskeletal: Negative.   Skin: Negative.   Allergic/Immunologic: Negative.   Neurological: Negative.   Hematological: Negative.   Psychiatric/Behavioral: The patient is nervous/anxious.   All other systems  reviewed and are negative.  Observations/Objective: Physical Exam Neurological:     Mental Status: She is alert and oriented to person, place, and time.  Psychiatric:        Mood and Affect: Mood normal.        Behavior: Behavior normal.        Thought Content: Thought content normal.        Judgment: Judgment normal.     Assessment and Plan: 1. Class 2 obesity due to excess calories without serious comorbidity with body mass index (BMI) of 36.0 to 36.9 in adult Overall reports doing well with this, but feels like she needs to lose more weight.  We will look to possibly offer some guidance with this in the near future after she has her annual visit with Dr. Lodema Hong.  Possibly could suggest weight loss medication if she would like or need.  Further encouraged her to monitor what she eats, eating a heart healthy, low-fat low-cholesterol diet is beneficial.  Along with trying to exercise regularly 30 minutes daily, at least 5 days of the week.  We will look to get labs 1 week prior to her next annual appointment with Dr. Lodema Hong.  - CBC with Differential/Platelet - COMPLETE METABOLIC PANEL WITH GFR - Hemoglobin A1c - Lipid panel  2. Jaw anomaly Reports that she thinks this mass area is smaller now.  Is not giving her any trouble.  Will assess this in person at next appointment.  3. Vitamin D deficiency Previously was taken vitamin D weekly.  For vitamin D deficiency.  Will recheck her vitamin D this year to see if she needs to be placed back  on supplementation.  Gave guidance as far supplementation and vitamin recommendations on AVS. Educated that a well-balanced diet with fresh fruits and vegetables and dairy usually get the amount of nutrients needed for someone on a regular basis.  But if needed we can supplement.  - VITAMIN D 25 Hydroxy (Vit-D Deficiency, Fractures)  4. IGT (impaired glucose tolerance) Secondary to previously elevated A1c's and impaired glucose tolerance will be  checking A1c to make sure that she is in the diabetic range and to encourage ongoing management with diet.  - Hemoglobin A1c  Follow Up Instructions: AVS printed and mailed   I discussed the assessment and treatment plan with the patient. The patient was provided an opportunity to ask questions and all were answered. The patient agreed with the plan and demonstrated an understanding of the instructions.   The patient was advised to call back or seek an in-person evaluation if the symptoms worsen or if the condition fails to improve as anticipated.  I provided 15 minutes of non-face-to-face time during this encounter.   Freddy FinnerHannah M Brentyn Seehafer, NP

## 2018-04-27 NOTE — Patient Instructions (Addendum)
    Thank you for pleating your office visit via WebEx today.. I appreciate the opportunity to provide you with the care for your health and wellness. Today we discussed: Overall health and vitamins.  I have placed labs for you to get 1 week prior to your next appointment which will be in either July or early August.  At that time you will see Dr. Lodema Hong for your annual wellness visit without Pap.  Talked about vitamins overall.  Secondary to you still having a period/menstrual cycle, it is good for you to get iron in your diet.  This helps to replenish your blood cells.  Additionally being a woman it is important for [place] to have calcium and vitamin D.  You have had vitamin D deficiency in the past, and were on vitamin D at one time.  We will look at getting the vitamin D rechecked in your labs.  If you are consistently staying low.  Even with possibly starting a multivitamin with vitamin D in it we need to consider putting you back on supplementation for this.  You can consider starting a supplementation of vitamin D3, or you can take a multivitamin that has calcium, vitamin D, and iron in it.  If you are overall eating a well-balanced diet with fresh fruits and vegetables regularly you probably are getting the nutrients that you need.  If you do not consume a lot of dairy it might be wise for you to consume some calcium and vitamin D via a supplement to help with bone health.  If you notice that you feel excessively tired after having your periods or cycles, sometimes having iron in your diet about a week or so before and several weeks after can help boost that red blood cell production to help you feel less fatigue.  Generally you would be on a multivitamin/ iron supplement during that timeframe.  Taking an iron supplement can cause constipation, sometimes people rather take the multivitamin they do not have as much significant constipation with the iron when it is given that way.  WASH YOUR HANDS  WELL AND FREQUENTLY. AVOID TOUCHING YOUR FACE, UNLESS YOUR HANDS ARE FRESHLY WASHED.  GET FRESH AIR DAILY. STAY HYDRATED WITH WATER.   It was a pleasure to see you and I look forward to continuing to work together on your health and well-being. Please do not hesitate to call the office if you need care or have questions about your care.  Have a wonderful day and week. With Gratitude, Tereasa Coop, DNP, AGNP-BC

## 2018-06-19 ENCOUNTER — Other Ambulatory Visit (HOSPITAL_COMMUNITY): Payer: Self-pay | Admitting: Otolaryngology

## 2018-06-19 ENCOUNTER — Other Ambulatory Visit: Payer: Self-pay | Admitting: Otolaryngology

## 2018-06-19 DIAGNOSIS — K118 Other diseases of salivary glands: Secondary | ICD-10-CM

## 2018-07-03 ENCOUNTER — Other Ambulatory Visit: Payer: Self-pay

## 2018-07-03 ENCOUNTER — Ambulatory Visit (HOSPITAL_COMMUNITY)
Admission: RE | Admit: 2018-07-03 | Discharge: 2018-07-03 | Disposition: A | Discharge status: Home / Self Care | Payer: No Typology Code available for payment source | Source: Ambulatory Visit | Attending: Otolaryngology | Admitting: Otolaryngology

## 2018-07-03 DIAGNOSIS — K118 Other diseases of salivary glands: Secondary | ICD-10-CM | POA: Diagnosis present

## 2018-07-03 MED ORDER — IOHEXOL 300 MG/ML  SOLN
75.0000 mL | Freq: Once | INTRAMUSCULAR | Status: AC | PRN
  Administered 2018-07-03: 17:00:00 75 mL via INTRAVENOUS

## 2018-08-01 ENCOUNTER — Encounter: Payer: BLUE CROSS/BLUE SHIELD | Admitting: Family Medicine

## 2019-02-04 ENCOUNTER — Telehealth: Payer: Self-pay

## 2019-02-04 NOTE — Telephone Encounter (Signed)
Pt LVM that she was covid + 2 weeks ago, and has questions please call her at [phone]

## 2019-02-05 NOTE — Telephone Encounter (Signed)
She had very mild covid symptoms and is out of quarantine but still having thick yellow mucus coming from her sinuses and had been taking sudafed but not helping and wanted to know if she needed anything else. Uses [facility]

## 2019-02-05 NOTE — Telephone Encounter (Signed)
Called pt to let her know I put her on for phone visit tomorrow 2-3 at 10:20 with dr Lodema Hong

## 2019-02-05 NOTE — Telephone Encounter (Signed)
Phone visit needed pls, thanks

## 2019-02-06 ENCOUNTER — Ambulatory Visit (INDEPENDENT_AMBULATORY_CARE_PROVIDER_SITE_OTHER): Payer: No Typology Code available for payment source | Admitting: Family Medicine

## 2019-02-06 ENCOUNTER — Other Ambulatory Visit: Payer: Self-pay

## 2019-02-06 ENCOUNTER — Encounter: Payer: Self-pay | Admitting: Family Medicine

## 2019-02-06 VITALS — BP 110/72 | Ht 63.0 in | Wt 212.0 lb

## 2019-02-06 DIAGNOSIS — E669 Obesity, unspecified: Secondary | ICD-10-CM

## 2019-02-06 DIAGNOSIS — R7301 Impaired fasting glucose: Secondary | ICD-10-CM

## 2019-02-06 DIAGNOSIS — J0120 Acute ethmoidal sinusitis, unspecified: Secondary | ICD-10-CM

## 2019-02-06 DIAGNOSIS — E559 Vitamin D deficiency, unspecified: Secondary | ICD-10-CM | POA: Diagnosis not present

## 2019-02-06 DIAGNOSIS — Z13220 Encounter for screening for lipoid disorders: Secondary | ICD-10-CM

## 2019-02-06 DIAGNOSIS — R481 Agnosia: Secondary | ICD-10-CM | POA: Diagnosis not present

## 2019-02-06 DIAGNOSIS — R430 Anosmia: Secondary | ICD-10-CM

## 2019-02-06 MED ORDER — FLUCONAZOLE 150 MG PO TABS: 150.0000 mg | ORAL_TABLET | Freq: Once | ORAL | 0 refills | Status: AC

## 2019-02-06 MED ORDER — PREDNISONE 10 MG PO TABS: 10.0000 mg | ORAL_TABLET | Freq: Two times a day (BID) | ORAL | 0 refills | Status: DC

## 2019-02-06 MED ORDER — AZITHROMYCIN 250 MG PO TABS: ORAL_TABLET | ORAL | 0 refills | Status: DC

## 2019-02-06 NOTE — Patient Instructions (Addendum)
F/u in office with MD in 6 months, call if you need me before  Nurse visit for TdAp next week Thursday at 2 pm  Please get fasting labs ordered in 04/2018 ( nurse please re order, [facility]) next week  Medication fior sinus infection is prescribed  It is important that you exercise regularly at least 30 minutes 5 times a week. If you develop chest pain, have severe difficulty breathing, or feel very tired, stop exercising immediately and seek medical attention   Think about what you will eat, plan ahead. Choose " clean, green, fresh or frozen" over canned, processed or packaged foods which are more sugary, salty and fatty. 70 to 75% of food eaten should be vegetables and fruit. Three meals at set times with snacks allowed between meals, but they must be fruit or vegetables. Aim to eat over a 12 hour period , example 7 am to 7 pm, and STOP after  your last meal of the day. Drink water,generally about 64 ounces per day, no other drink is as healthy. Fruit juice is best enjoyed in a healthy way, by EATING the fruit. Thanks for choosing [facility], we consider it a privelige to serve you.

## 2019-02-06 NOTE — Progress Notes (Signed)
Virtual Visit via Telephone Note  I connected with [name] on 02/06/19 at 10:20 AM EST by telephone and verified that I am speaking with the correct person using two identifiers.  Location: Patient: home Provider: office   I discussed the limitations, risks, security and privacy concerns of performing an evaluation and management service by telephone and the availability of in person appointments. I also discussed with the patient that there may be a patient responsible charge related to this service. The patient expressed understanding and agreed to proceed.   History of Present Illness: Diagnosed with Covid 01/24/2019, symptoms started on 01/18/2019, started with head congestion, sudafed d , lost sense of smell,5 days after symptom onset taste went partially the following , intermittent cough, non productive, some low back pain , only 30 % recovery of smell Sinus pressure behind nose, before was over right eye, also thick green and bloody Observations/Objective: BP 110/72   Ht 5\' 3"  (1.6 m)   Wt 212 lb (96.2 kg)   BMI 37.55 kg/m  Good communication with no confusion and intact memory. Alert and oriented x 3 No signs of respiratory distress during speech    Assessment and Plan: Acute ethmoidal sinusitis z pack prescribed  Morbid obesity  Patient re-educated about  the importance of commitment to a  minimum of 150 minutes of exercise per week as able.  The importance of healthy food choices with portion control discussed, as well as eating regularly and within a 12 hour window most days. The need to choose "clean , green" food 50 to 75% of the time is discussed, as well as to make water the primary drink and set a goal of 64 ounces water daily.    Weight /BMI 02/06/2019 04/27/2018 12/13/2017  WEIGHT 212 lb 207 lb 214 lb 1.9 oz  HEIGHT 5\' 3"  5\' 3"  5\' 3"   BMI 37.55 kg/m2 36.67 kg/m2 37.93 kg/m2      Loss of perception for smell Has recovered approx 30 % smell  following covid, short course of prednisone    Follow Up Instructions:    I discussed the assessment and treatment plan with the patient. The patient was provided an opportunity to ask questions and all were answered. The patient agreed with the plan and demonstrated an understanding of the instructions.   The patient was advised to call back or seek an in-person evaluation if the symptoms worsen or if the condition fails to improve as anticipated.  I provided 12  minutes of non-face-to-face time during this encounter.   14/11/2017, MD

## 2019-02-06 NOTE — Assessment & Plan Note (Signed)
  Patient re-educated about  the importance of commitment to a  minimum of 150 minutes of exercise per week as able.  The importance of healthy food choices with portion control discussed, as well as eating regularly and within a 12 hour window most days. The need to choose "clean , green" food 50 to 75% of the time is discussed, as well as to make water the primary drink and set a goal of 64 ounces water daily.    Weight /BMI 02/06/2019 04/27/2018 12/13/2017  WEIGHT 212 lb 207 lb 214 lb 1.9 oz  HEIGHT 5\' 3"  5\' 3"  5\' 3"   BMI 37.55 kg/m2 36.67 kg/m2 37.93 kg/m2

## 2019-02-06 NOTE — Assessment & Plan Note (Signed)
Has recovered approx 30 % smell following covid, short course of prednisone

## 2019-02-06 NOTE — Assessment & Plan Note (Signed)
z pack prescribed 

## 2019-02-14 ENCOUNTER — Ambulatory Visit (INDEPENDENT_AMBULATORY_CARE_PROVIDER_SITE_OTHER): Payer: No Typology Code available for payment source | Admitting: *Deleted

## 2019-02-14 ENCOUNTER — Other Ambulatory Visit: Payer: Self-pay

## 2019-02-14 DIAGNOSIS — Z23 Encounter for immunization: Secondary | ICD-10-CM

## 2019-08-06 ENCOUNTER — Encounter: Payer: Self-pay | Admitting: Family Medicine

## 2019-08-06 ENCOUNTER — Ambulatory Visit (INDEPENDENT_AMBULATORY_CARE_PROVIDER_SITE_OTHER): Payer: No Typology Code available for payment source | Admitting: Family Medicine

## 2019-08-06 ENCOUNTER — Other Ambulatory Visit: Payer: Self-pay

## 2019-08-06 VITALS — BP 124/76 | HR 85 | Resp 16 | Ht 63.0 in | Wt 217.0 lb

## 2019-08-06 DIAGNOSIS — E559 Vitamin D deficiency, unspecified: Secondary | ICD-10-CM

## 2019-08-06 DIAGNOSIS — F419 Anxiety disorder, unspecified: Secondary | ICD-10-CM

## 2019-08-06 DIAGNOSIS — Z9109 Other allergy status, other than to drugs and biological substances: Secondary | ICD-10-CM

## 2019-08-06 NOTE — Patient Instructions (Addendum)
F/U in 6 months, call if you need me before  Please getcovid vaccines today   Labs today, hepatitis c screen and others ordered in Feb which need reprinting    It is important that you exercise regularly at least 30 minutes 5 times a week. If you develop chest pain, have severe difficulty breathing, or feel very tired, stop exercising immediately and seek medical attention  Think about what you will eat, plan ahead. Choose " clean, green, fresh or frozen" over canned, processed or packaged foods which are more sugary, salty and fatty. 70 to 75% of food eaten should be vegetables and fruit. Three meals at set times with snacks allowed between meals, but they must be fruit or vegetables. Aim to eat over a 12 hour period , example 7 am to 7 pm, and STOP after  your last meal of the day. Drink water,generally about 64 ounces per day, no other drink is as healthy. Fruit juice is best enjoyed in a healthy way, by EATING the fruit.   Will message you re post covid concerns

## 2019-08-06 NOTE — Progress Notes (Signed)
   [name]     MRN: [id number]      DOB: [birth year]   HPI [name] is here for follow up and re-evaluation of chronic medical conditions, medication management and review of any available recent lab and radiology data.  Preventive health is updated, specifically  Cancer screening and Immunization.   Questions or concerns regarding consultations or procedures which the PT has had in the interim are  addressed. The PT denies any adverse reactions to current medications since the last visit.  C/o intermittent left neck pain , sick smell, brain fog once / month on the 16th was dx , happens from 18th for  3 days later roughly Questions to relationship to covid , and what can be done.  Also has concerns re safety of the vaccine and whether she should take it as sh already has had covid. She complains of increased anxiety since resurgence of this new variant of Covid.  She reports overeating with weight gain and insufficient exercise she intends to change this  ROS Denies recent fever or chills. Denies sinus pressure, nasal congestion, ear pain or sore throat. Denies chest congestion, productive cough or wheezing. Denies chest pains, palpitations and leg swelling Denies abdominal pain,, vomiting,diarrhea or constipation.   Denies dysuria, frequency, hesitancy or incontinence. Denies joint pain, swelling and limitation in mobility. Denies, seizures, numbness, or tingling. Denies depression,  or insomnia. Denies skin break down or rash.   PE  BP 124/76   Pulse 85   Resp 16   Ht 5\' 3"  (1.6 m)   Wt 217 lb (98.4 kg)   SpO2 100%   BMI 38.44 kg/m   Patient alert and oriented and in no cardiopulmonary distress.  HEENT: No facial asymmetry, EOMI,     Neck supple .  Chest: Clear to auscultation bilaterally.  CVS: S1, S2 no murmurs, no S3.Regular rate.  ABD: Soft non tender.   Ext: No edema  MS: Adequate ROM spine, shoulders, hips and knees.  Skin: Intact, no  ulcerations or rash noted.  Psych: Good eye contact, normal affect. Memory intact  anxious not  depressed appearing.  CNS: CN 2-12 intact, power,  normal throughout.no focal deficits noted.   Assessment & Plan  Anxiety Increased post covid infection and now at higher level with new variant.no medication or therapy desired ir indicated currrently  Morbid obesity  Patient re-educated about  the importance of commitment to a  minimum of 150 minutes of exercise per week as able.  The importance of healthy food choices with portion control discussed, as well as eating regularly and within a 12 hour window most days. The need to choose "clean , green" food 50 to 75% of the time is discussed, as well as to make water the primary drink and set a goal of 64 ounces water daily.    Weight /BMI 08/06/2019 02/06/2019 04/27/2018  WEIGHT 217 lb 212 lb 207 lb  HEIGHT 5\' 3"  5\' 3"  5\' 3"   BMI 38.44 kg/m2 37.55 kg/m2 36.67 kg/m2    Deteriorated 10 pound weight gain in past 1 year, anxiety driven, has recently resumed exercise program  Vitamin D deficiency Adequately supplemented, continue same

## 2019-08-10 ENCOUNTER — Encounter: Payer: Self-pay | Admitting: Family Medicine

## 2019-08-10 DIAGNOSIS — F419 Anxiety disorder, unspecified: Secondary | ICD-10-CM | POA: Insufficient documentation

## 2019-08-10 DIAGNOSIS — Z9109 Other allergy status, other than to drugs and biological substances: Secondary | ICD-10-CM | POA: Insufficient documentation

## 2019-08-10 NOTE — Assessment & Plan Note (Signed)
  Patient re-educated about  the importance of commitment to a  minimum of 150 minutes of exercise per week as able.  The importance of healthy food choices with portion control discussed, as well as eating regularly and within a 12 hour window most days. The need to choose "clean , green" food 50 to 75% of the time is discussed, as well as to make water the primary drink and set a goal of 64 ounces water daily.    Weight /BMI 08/06/2019 02/06/2019 04/27/2018  WEIGHT 217 lb 212 lb 207 lb  HEIGHT 5\' 3"  5\' 3"  5\' 3"   BMI 38.44 kg/m2 37.55 kg/m2 36.67 kg/m2    Deteriorated 10 pound weight gain in past 1 year, anxiety driven, has recently resumed exercise program

## 2019-08-10 NOTE — Assessment & Plan Note (Signed)
Adequately supplemented, continue same

## 2019-08-10 NOTE — Assessment & Plan Note (Signed)
Increased post covid infection and now at higher level with new variant.no medication or therapy desired ir indicated currrently

## 2019-08-12 LAB — COMPLETE METABOLIC PANEL WITH GFR
AG Ratio: 1.3 (calc) (ref 1.0–2.5)
ALT: 10 U/L (ref 6–29)
AST: 17 U/L (ref 10–30)
Albumin: 4.2 g/dL (ref 3.6–5.1)
Alkaline phosphatase (APISO): 62 U/L (ref 31–125)
BUN: 7 mg/dL (ref 7–25)
CO2: 28 mmol/L (ref 20–32)
Calcium: 9.6 mg/dL (ref 8.6–10.2)
Chloride: 102 mmol/L (ref 98–110)
Creat: 0.88 mg/dL (ref 0.50–1.10)
GFR, Est African American: 93 mL/min/{1.73_m2} (ref >=60)
GFR, Est Non African American: 80 mL/min/{1.73_m2} (ref >=60)
Globulin: 3.3 g/dL (calc) (ref 1.9–3.7)
Glucose, Bld: 102 mg/dL — ABNORMAL HIGH (ref 65–99)
Potassium: 4.6 mmol/L (ref 3.5–5.3)
Sodium: 139 mmol/L (ref 135–146)
Total Bilirubin: 0.6 mg/dL (ref 0.2–1.2)
Total Protein: 7.5 g/dL (ref 6.1–8.1)

## 2019-08-12 LAB — LIPID PANEL
Cholesterol: 143 mg/dL (ref <200)
HDL: 61 mg/dL (ref >=50)
LDL Cholesterol (Calc): 65 mg/dL (calc)
Non-HDL Cholesterol (Calc): 82 mg/dL (calc) (ref <130)
Total CHOL/HDL Ratio: 2.3 (calc) (ref <5.0)
Triglycerides: 88 mg/dL (ref <150)

## 2019-08-12 LAB — TEST AUTHORIZATION

## 2019-08-12 LAB — CBC
HCT: 42.8 % (ref 35.0–45.0)
Hemoglobin: 13.7 g/dL (ref 11.7–15.5)
MCH: 27.8 pg (ref 27.0–33.0)
MCHC: 32 g/dL (ref 32.0–36.0)
MCV: 86.8 fL (ref 80.0–100.0)
MPV: 11 fL (ref 7.5–12.5)
Platelets: 467 10*3/uL — ABNORMAL HIGH (ref 140–400)
RBC: 4.93 10*6/uL (ref 3.80–5.10)
RDW: 13.1 % (ref 11.0–15.0)
WBC: 8.5 10*3/uL (ref 3.8–10.8)

## 2019-08-12 LAB — TEST AUTHORIZATION 2

## 2019-08-12 LAB — HEMOGLOBIN A1C
Hgb A1c MFr Bld: 5.4 % of total Hgb (ref <5.7)
Mean Plasma Glucose: 108 (calc)
eAG (mmol/L): 6 (calc)

## 2019-08-12 LAB — TSH: TSH: 1.49 mIU/L

## 2019-08-12 LAB — VITAMIN D 25 HYDROXY (VIT D DEFICIENCY, FRACTURES): Vit D, 25-Hydroxy: 48 ng/mL (ref 30–100)

## 2019-08-12 LAB — HEPATITIS C ANTIBODY
Hepatitis C Ab: NONREACTIVE
SIGNAL TO CUT-OFF: 0.02 (ref <1.00)

## 2019-10-10 ENCOUNTER — Telehealth (INDEPENDENT_AMBULATORY_CARE_PROVIDER_SITE_OTHER): Payer: No Typology Code available for payment source | Admitting: Family Medicine

## 2019-10-10 ENCOUNTER — Other Ambulatory Visit: Payer: Self-pay

## 2019-10-10 VITALS — BP 139/80 | Temp 98.4°F | Ht 63.0 in | Wt 215.0 lb

## 2019-10-10 DIAGNOSIS — J0100 Acute maxillary sinusitis, unspecified: Secondary | ICD-10-CM

## 2019-10-10 MED ORDER — AZITHROMYCIN 250 MG PO TABS: ORAL_TABLET | ORAL | 0 refills | Status: DC

## 2019-10-10 MED ORDER — FLUCONAZOLE 150 MG PO TABS
150.0000 mg | ORAL_TABLET | Freq: Once | ORAL | 0 refills | Status: AC
Start: 2019-10-10 — End: 2019-10-10

## 2019-10-10 NOTE — Progress Notes (Signed)
Virtual Visit via Telephone Note  I connected with [name] on 10/10/19 at  4:20 PM EDT by video and verified that I am speaking with the correct person using two identifiers.  Location: Patient: home Provider: office   I discussed the limitations, risks, security and privacy concerns of performing an evaluation and management service by telephone and the availability of in person appointments. I also discussed with the patient that there may be a patient responsible charge related to this service. The patient expressed understanding and agreed to proceed.   History of Present Illness: [name]  And yellow sinus pressure and drainage x 3  Days , bilateral ear pressure, no fever or chills No productive cough or sore throat. No one else is sick Npo other concerns    Observations/Objective: BP 139/80 (BP Location: Left Arm, Patient Position: Sitting, Cuff Size: Normal)   Temp 98.4 F (36.9 C)   Ht 5\' 3"  (1.6 m)   Wt 215 lb (97.5 kg)   LMP 10/03/2019   BMI 38.09 kg/m  Good communication with no confusion and intact memory. Alert and oriented x 3 No signs of respiratory distress during speech  Points over maxillary and ethmoid sinuses to localize pressure  Assessment and Plan:  Maxillary sinusitis Z pack and fluconazole are prescribed , and she is provided with general information re saline flushes and sinusitis   Follow Up Instructions:    I discussed the assessment and treatment plan with the patient. The patient was provided an opportunity to ask questions and all were answered. The patient agreed with the plan and demonstrated an understanding of the instructions.   The patient was advised to call back or seek an in-person evaluation if the symptoms worsen or if the condition fails to improve as anticipated.  I provided 10  minutes of non-face-to-face time during this encounter.   10/05/2019, MD

## 2019-10-10 NOTE — Patient Instructions (Addendum)
F/u  As before, call if you need me sooner  You are treated today for acute sinusitis , Z pack and fluconazole are sent to your pharmacy     Sinusitis, Adult Sinusitis is soreness and swelling (inflammation) of your sinuses. Sinuses are hollow spaces in the bones around your face. They are located:  Around your eyes.  In the middle of your forehead.  Behind your nose.  In your cheekbones. Your sinuses and nasal passages are lined with a fluid called mucus. Mucus drains out of your sinuses. Swelling can trap mucus in your sinuses. This lets germs (bacteria, virus, or fungus) grow, which leads to infection. Most of the time, this condition is caused by a virus. What are the causes? This condition is caused by:  Allergies.  Asthma.  Germs.  Things that block your nose or sinuses.  Growths in the nose (nasal polyps).  Chemicals or irritants in the air.  Fungus (rare). What increases the risk? You are more likely to develop this condition if:  You have a weak body defense system (immune system).  You do a lot of swimming or diving.  You use nasal sprays too much.  You smoke. What are the signs or symptoms? The main symptoms of this condition are pain and a feeling of pressure around the sinuses. Other symptoms include:  Stuffy nose (congestion).  Runny nose (drainage).  Swelling and warmth in the sinuses.  Headache.  Toothache.  A cough that may get worse at night.  Mucus that collects in the throat or the back of the nose (postnasal drip).  Being unable to smell and taste.  Being very tired (fatigue).  A fever.  Sore throat.  Bad breath. How is this diagnosed? This condition is diagnosed based on:  Your symptoms.  Your medical history.  A physical exam.  Tests to find out if your condition is short-term (acute) or long-term (chronic). Your doctor may: ? Check your nose for growths (polyps). ? Check your sinuses using a tool that has a light  (endoscope). ? Check for allergies or germs. ? Do imaging tests, such as an MRI or CT scan. How is this treated? Treatment for this condition depends on the cause and whether it is short-term or long-term.  If caused by a virus, your symptoms should go away on their own within 10 days. You may be given medicines to relieve symptoms. They include: ? Medicines that shrink swollen tissue in the nose. ? Medicines that treat allergies (antihistamines). ? A spray that treats swelling of the nostrils. ? Rinses that help get rid of thick mucus in your nose (nasal saline washes).  If caused by bacteria, your doctor may wait to see if you will get better without treatment. You may be given antibiotic medicine if you have: ? A very bad infection. ? A weak body defense system.  If caused by growths in the nose, you may need to have surgery. Follow these instructions at home: Medicines  Take, use, or apply over-the-counter and prescription medicines only as told by your doctor. These may include nasal sprays.  If you were prescribed an antibiotic medicine, take it as told by your doctor. Do not stop taking the antibiotic even if you start to feel better. Hydrate and humidify   Drink enough water to keep your pee (urine) pale yellow.  Use a cool mist humidifier to keep the humidity level in your home above 50%.  Breathe in steam for 10-15 minutes, 3-4 times  a day, or as told by your doctor. You can do this in the bathroom while a hot shower is running.  Try not to spend time in cool or dry air. Rest  Rest as much as you can.  Sleep with your head raised (elevated).  Make sure you get enough sleep each night. General instructions   Put a warm, moist washcloth on your face 3-4 times a day, or as often as told by your doctor. This will help with discomfort.  Wash your hands often with soap and water. If there is no soap and water, use hand sanitizer.  Do not smoke. Avoid being around  people who are smoking (secondhand smoke).  Keep all follow-up visits as told by your doctor. This is important. Contact a doctor if:  You have a fever.  Your symptoms get worse.  Your symptoms do not get better within 10 days. Get help right away if:  You have a very bad headache.  You cannot stop throwing up (vomiting).  You have very bad pain or swelling around your face or eyes.  You have trouble seeing.  You feel confused.  Your neck is stiff.  You have trouble breathing. Summary  Sinusitis is swelling of your sinuses. Sinuses are hollow spaces in the bones around your face.  This condition is caused by tissues in your nose that become inflamed or swollen. This traps germs. These can lead to infection.  If you were prescribed an antibiotic medicine, take it as told by your doctor. Do not stop taking it even if you start to feel better.  Keep all follow-up visits as told by your doctor. This is important. This information is not intended to replace advice given to you by your health care provider. Make sure you discuss any questions you have with your health care provider. Document Revised: 05/22/2017 Document Reviewed: 05/22/2017 Elsevier Patient Education  2020 [employer].

## 2019-10-11 ENCOUNTER — Encounter: Payer: Self-pay | Admitting: Family Medicine

## 2019-10-11 NOTE — Assessment & Plan Note (Signed)
Z pack and fluconazole are prescribed , and she is provided with general information re saline flushes and sinusitis

## 2019-10-18 ENCOUNTER — Other Ambulatory Visit: Payer: Self-pay

## 2019-10-18 ENCOUNTER — Ambulatory Visit (INDEPENDENT_AMBULATORY_CARE_PROVIDER_SITE_OTHER): Payer: No Typology Code available for payment source

## 2019-10-18 DIAGNOSIS — Z23 Encounter for immunization: Secondary | ICD-10-CM

## 2019-10-31 ENCOUNTER — Encounter: Payer: Self-pay | Admitting: Family Medicine

## 2020-02-06 ENCOUNTER — Other Ambulatory Visit: Payer: Self-pay

## 2020-02-06 ENCOUNTER — Telehealth (INDEPENDENT_AMBULATORY_CARE_PROVIDER_SITE_OTHER): Payer: No Typology Code available for payment source | Admitting: Family Medicine

## 2020-02-06 ENCOUNTER — Encounter: Payer: Self-pay | Admitting: Family Medicine

## 2020-02-06 VITALS — BP 126/67 | Ht 63.0 in | Wt 228.0 lb

## 2020-02-06 DIAGNOSIS — F419 Anxiety disorder, unspecified: Secondary | ICD-10-CM | POA: Diagnosis not present

## 2020-02-06 DIAGNOSIS — K219 Gastro-esophageal reflux disease without esophagitis: Secondary | ICD-10-CM | POA: Diagnosis not present

## 2020-02-06 DIAGNOSIS — R079 Chest pain, unspecified: Secondary | ICD-10-CM

## 2020-02-06 MED ORDER — PANTOPRAZOLE SODIUM 40 MG PO TBEC
40.0000 mg | DELAYED_RELEASE_TABLET | Freq: Every day | ORAL | 1 refills | Status: DC
Start: 2020-02-06 — End: 2021-11-30

## 2020-02-06 NOTE — Progress Notes (Signed)
Virtual Visit via video  I connected with [name] on 02/06/20 at  1:00 PM EST by video  Location: Patient: home Provider: work     History of Present Illness: Intermittent pain under left breast, occurs with deep breath also  when lying on her left side, over past 4 days she experienced chest pain 3 of those days, lasts less than 2 seconds, non radiating, no associated nausea , diaphoresis , or light headedness. No associated symptoms of anxiety or panic, wants reassurance that this is not heartrelated Had cola yesterday burped and felt better, none since that time. She does note increased GERD symptoms, she has gained weight, she does have a h/o GERD Concerned about weight gain, has started exercising at home with an eliptical,  and  Has also joined weight watchers   Observations/Objective: BP 126/67   Ht 5\' 3"  (1.6 m)   Wt 228 lb (103.4 kg)   LMP 01/25/2020   BMI 40.39 kg/m  Good communication with no confusion and intact memory. Alert and oriented x 3 No signs of respiratory distress during speech    Assessment and Plan: GERD (gastroesophageal reflux disease) Increased symptoms of gerd x 6 month, start ppi, check H pylori   Anxiety Reports increased symptoms of anxiety in last 6 months, reports worrying over everything,including her health.No interest I medication or counseling, working on exercise, weight loss and lifestyle modification  Morbid obesity  Patient re-educated about  the importance of commitment to a  minimum of 150 minutes of exercise per week as able.  The importance of healthy food choices with portion control discussed, as well as eating regularly and within a 12 hour window most days. The need to choose "clean , green" food 50 to 75% of the time is discussed, as well as to make water the primary drink and set a goal of 64 ounces water daily.    Weight /BMI 02/06/2020 10/10/2019 08/06/2019  WEIGHT 228 lb 215 lb 217 lb  HEIGHT 5\' 3"  5\' 3"  5\' 3"    BMI 40.39 kg/m2 38.09 kg/m2 38.44 kg/m2      Chest pain in adult New onset left chest pain, no specific aggravating or relieving factors. Pt has concerns re possibility of heart disease as an underlying issue. I advise this is most unlikely given symptoms reported, however , in office eval and EKG to be arranged in the next 1 to 2 weeks    Follow Up Instructions:    I discussed the assessment and treatment plan with the patient. The patient was provided an opportunity to ask questions and all were answered. The patient agreed with the plan and demonstrated an understanding of the instructions.   The patient was advised to call back or seek an in-person evaluation if the symptoms worsen or if the condition fails to improve as anticipated.  I provided 14 minutes of non-face-to-face time during this encounter.   10/06/2019, MD  '

## 2020-02-06 NOTE — Assessment & Plan Note (Addendum)
Increased symptoms of gerd x 6 month, start ppi, check H pylori

## 2020-02-06 NOTE — Patient Instructions (Addendum)
Follow-up in 5 months, GAD SCORE AT VISIT, call if you need me sooner.  Appointment to be scheduled with Dr. Allena Katz for evaluation of new chest pain with EKG in the office.  ANXIETY WILL BE ADDRESSED AT NEXT VISIT, with improved lifestyle , this will improve  It is important that you exercise regularly at least 30 minutes 5 times a week. If you develop chest pain, have severe difficulty breathing, or feel very tired, stop exercising immediately and seek medical attention       Weight loss goal of 12 to 15 pounds in the next 5 months.  New for reflux is Protonix 1 tablet daily.  Please get blood test for H. Pylori to see if you need treatment as this contributes to  intestinal problems .     Calorie Counting for Weight Loss Calories are units of energy. Your body needs a certain number of calories from food to keep going throughout the day. When you eat or drink more calories than your body needs, your body stores the extra calories mostly as fat. When you eat or drink fewer calories than your body needs, your body burns fat to get the energy it needs. Calorie counting means keeping track of how many calories you eat and drink each day. Calorie counting can be helpful if you need to lose weight. If you eat fewer calories than your body needs, you should lose weight. Ask your health care provider what a healthy weight is for you. For calorie counting to work, you will need to eat the right number of calories each day to lose a healthy amount of weight per week. A dietitian can help you figure out how many calories you need in a day and will suggest ways to reach your calorie goal.  A healthy amount of weight to lose each week is usually 1-2 lb (0.5-0.9 kg). This usually means that your daily calorie intake should be reduced by 500-750 calories.  Eating 1,200-1,500 calories a day can help most women lose weight.  Eating 1,500-1,800 calories a day can help most men lose weight. What do I  need to know about calorie counting? Work with your health care provider or dietitian to determine how many calories you should get each day. To meet your daily calorie goal, you will need to:  Find out how many calories are in each food that you would like to eat. Try to do this before you eat.  Decide how much of the food you plan to eat.  Keep a food log. Do this by writing down what you ate and how many calories it had. To successfully lose weight, it is important to balance calorie counting with a healthy lifestyle that includes regular activity. Where do I find calorie information? The number of calories in a food can be found on a Nutrition Facts label. If a food does not have a Nutrition Facts label, try to look up the calories online or ask your dietitian for help. Remember that calories are listed per serving. If you choose to have more than one serving of a food, you will have to multiply the calories per serving by the number of servings you plan to eat. For example, the label on a package of bread might say that a serving size is 1 slice and that there are 90 calories in a serving. If you eat 1 slice, you will have eaten 90 calories. If you eat 2 slices, you will have eaten 180 calories.  How do I keep a food log? After each time that you eat, record the following in your food log as soon as possible:  What you ate. Be sure to include toppings, sauces, and other extras on the food.  How much you ate. This can be measured in cups, ounces, or number of items.  How many calories were in each food and drink.  The total number of calories in the food you ate. Keep your food log near you, such as in a pocket-sized notebook or on an app or website on your mobile phone. Some programs will calculate calories for you and show you how many calories you have left to meet your daily goal. What are some portion-control tips?  Know how many calories are in a serving. This will help you know  how many servings you can have of a certain food.  Use a measuring cup to measure serving sizes. You could also try weighing out portions on a kitchen scale. With time, you will be able to estimate serving sizes for some foods.  Take time to put servings of different foods on your favorite plates or in your favorite bowls and cups so you know what a serving looks like.  Try not to eat straight from a food's packaging, such as from a bag or box. Eating straight from the package makes it hard to see how much you are eating and can lead to overeating. Put the amount you would like to eat in a cup or on a plate to make sure you are eating the right portion.  Use smaller plates, glasses, and bowls for smaller portions and to prevent overeating.  Try not to multitask. For example, avoid watching TV or using your computer while eating. If it is time to eat, sit down at a table and enjoy your food. This will help you recognize when you are full. It will also help you be more mindful of what and how much you are eating. What are tips for following this plan? Reading food labels  Check the calorie count compared with the serving size. The serving size may be smaller than what you are used to eating.  Check the source of the calories. Try to choose foods that are high in protein, fiber, and vitamins, and low in saturated fat, trans fat, and sodium. Shopping  Read nutrition labels while you shop. This will help you make healthy decisions about which foods to buy.  Pay attention to nutrition labels for low-fat or fat-free foods. These foods sometimes have the same number of calories or more calories than the full-fat versions. They also often have added sugar, starch, or salt to make up for flavor that was removed with the fat.  Make a grocery list of lower-calorie foods and stick to it. Cooking  Try to cook your favorite foods in a healthier way. For example, try baking instead of frying.  Use  low-fat dairy products. Meal planning  Use more fruits and vegetables. One-half of your plate should be fruits and vegetables.  Include lean proteins, such as chicken, [place], and fish. Lifestyle Each week, aim to do one of the following:  150 minutes of moderate exercise, such as walking.  75 minutes of vigorous exercise, such as running. General information  Know how many calories are in the foods you eat most often. This will help you calculate calorie counts faster.  Find a way of tracking calories that works for you. Get creative. Try different  apps or programs if writing down calories does not work for you. What foods should I eat?  Eat nutritious foods. It is better to have a nutritious, high-calorie food, such as an avocado, than a food with few nutrients, such as a bag of potato chips.  Use your calories on foods and drinks that will fill you up and will not leave you hungry soon after eating. ? Examples of foods that fill you up are nuts and nut butters, vegetables, lean proteins, and high-fiber foods such as whole grains. High-fiber foods are foods with more than 5 g of fiber per serving.  Pay attention to calories in drinks. Low-calorie drinks include water and unsweetened drinks. The items listed above may not be a complete list of foods and beverages you can eat. Contact a dietitian for more information.   What foods should I limit? Limit foods or drinks that are not good sources of vitamins, minerals, or protein or that are high in unhealthy fats. These include:  Candy.  Other sweets.  Sodas, specialty coffee drinks, alcohol, and juice. The items listed above may not be a complete list of foods and beverages you should avoid. Contact a dietitian for more information. How do I count calories when eating out?  Pay attention to portions. Often, portions are much larger when eating out. Try these tips to keep portions smaller: ? Consider sharing a meal instead of  getting your own. ? If you get your own meal, eat only half of it. Before you start eating, ask for a container and put half of your meal into it. ? When available, consider ordering smaller portions from the menu instead of full portions.  Pay attention to your food and drink choices. Knowing the way food is cooked and what is included with the meal can help you eat fewer calories. ? If calories are listed on the menu, choose the lower-calorie options. ? Choose dishes that include vegetables, fruits, whole grains, low-fat dairy products, and lean proteins. ? Choose items that are boiled, broiled, grilled, or steamed. Avoid items that are buttered, battered, fried, or served with cream sauce. Items labeled as crispy are usually fried, unless stated otherwise. ? Choose water, low-fat milk, unsweetened iced tea, or other drinks without added sugar. If you want an alcoholic beverage, choose a lower-calorie option, such as a glass of wine or light beer. ? Ask for dressings, sauces, and syrups on the side. These are usually high in calories, so you should limit the amount you eat. ? If you want a salad, choose a garden salad and ask for grilled meats. Avoid extra toppings such as bacon, cheese, or fried items. Ask for the dressing on the side, or ask for olive oil and vinegar or lemon to use as dressing.  Estimate how many servings of a food you are given. Knowing serving sizes will help you be aware of how much food you are eating at restaurants. Where to find more information  Centers for Disease Control and Prevention: FootballExhibition.com.br  [place] Department of Agriculture: [employer] Summary  Calorie counting means keeping track of how many calories you eat and drink each day. If you eat fewer calories than your body needs, you should lose weight.  A healthy amount of weight to lose per week is usually 1-2 lb (0.5-0.9 kg). This usually means reducing your daily calorie intake by 500-750 calories.  The  number of calories in a food can be found on a Nutrition Facts label. If  a food does not have a Nutrition Facts label, try to look up the calories online or ask your dietitian for help.  Use smaller plates, glasses, and bowls for smaller portions and to prevent overeating.  Use your calories on foods and drinks that will fill you up and not leave you hungry shortly after a meal. This information is not intended to replace advice given to you by your health care provider. Make sure you discuss any questions you have with your health care provider. Document Revised: 01/31/2019 Document Reviewed: 01/31/2019 Elsevier Patient Education  2021 [employer].

## 2020-02-08 ENCOUNTER — Encounter: Payer: Self-pay | Admitting: Family Medicine

## 2020-02-08 DIAGNOSIS — R079 Chest pain, unspecified: Secondary | ICD-10-CM | POA: Insufficient documentation

## 2020-02-08 DIAGNOSIS — R0789 Other chest pain: Secondary | ICD-10-CM | POA: Insufficient documentation

## 2020-02-08 NOTE — Assessment & Plan Note (Signed)
New onset left chest pain, no specific aggravating or relieving factors. Pt has concerns re possibility of heart disease as an underlying issue. I advise this is most unlikely given symptoms reported, however , in office eval and EKG to be arranged in the next 1 to 2 weeks

## 2020-02-08 NOTE — Assessment & Plan Note (Signed)
  Patient re-educated about  the importance of commitment to a  minimum of 150 minutes of exercise per week as able.  The importance of healthy food choices with portion control discussed, as well as eating regularly and within a 12 hour window most days. The need to choose "clean , green" food 50 to 75% of the time is discussed, as well as to make water the primary drink and set a goal of 64 ounces water daily.    Weight /BMI 02/06/2020 10/10/2019 08/06/2019  WEIGHT 228 lb 215 lb 217 lb  HEIGHT 5\' 3"  5\' 3"  5\' 3"   BMI 40.39 kg/m2 38.09 kg/m2 38.44 kg/m2

## 2020-02-08 NOTE — Assessment & Plan Note (Signed)
Reports increased symptoms of anxiety in last 6 months, reports worrying over everything,including her health.No interest I medication or counseling, working on exercise, weight loss and lifestyle modification

## 2020-02-13 ENCOUNTER — Other Ambulatory Visit: Payer: Self-pay

## 2020-02-13 ENCOUNTER — Encounter: Payer: Self-pay | Admitting: Internal Medicine

## 2020-02-13 ENCOUNTER — Ambulatory Visit (INDEPENDENT_AMBULATORY_CARE_PROVIDER_SITE_OTHER): Payer: No Typology Code available for payment source | Admitting: Internal Medicine

## 2020-02-13 VITALS — BP 132/87 | HR 91 | Temp 99.2°F | Resp 18 | Ht 63.0 in | Wt 240.0 lb

## 2020-02-13 DIAGNOSIS — K219 Gastro-esophageal reflux disease without esophagitis: Secondary | ICD-10-CM

## 2020-02-13 DIAGNOSIS — R079 Chest pain, unspecified: Secondary | ICD-10-CM

## 2020-02-13 NOTE — Patient Instructions (Signed)
Please avoid hot or spicy food and take Pantoprazole or Pepcid.  Please continue to follow low salt diet and perform moderate exercise/walking at least 150 mins/week.

## 2020-02-13 NOTE — Progress Notes (Signed)
Acute Office Visit  Subjective:    Patient ID: [name], female    DOB: Oct 14, 1975, 45 y.o.   MRN: [id number]  Chief Complaint  Patient presents with  . Chest Pain    Chest pain about 2 weeks dr Lodema Hong would like an ekg has gained weight     HPI Patient is in today for evaluation of episodes of chest pain. She states that she had 2 episodes of pain, but she locates it underneath the left breast/left epigastric area. She has a h/o GERD and takes Pantoprazole PRN. She states that she has gained weight lately as she has not been watching over her diet. She denies any exertional chest pain, dyspnea or palpitations. She also says that she has been feeling stressed lately due to pandemic and it could be contributing to some of her symptoms.  Past Medical History:  Diagnosis Date  . Allergy    late Spring/early Summer  . Chronic constipation   . GERD (gastroesophageal reflux disease)   . Hx of varicella   . Hyperglycemia   . IBS (irritable bowel syndrome)     Past Surgical History:  Procedure Laterality Date  . BREAST REDUCTION SURGERY    . BREAST SURGERY Bilateral 2002   reduction  . bunnionectomy Left 2006  . right ankle    . TONSILLECTOMY  2004    Family History  Problem Relation Age of Onset  . Cancer Father        lung  . Diabetes Father   . Hypertension Sister   . Hypothyroidism Mother   . Hyperlipidemia Mother   . Hypertension Paternal Aunt   . Diabetes Paternal Aunt   . Diabetes Maternal Aunt     Social History   Socioeconomic History  . Marital status: Single    Spouse name: Not on file  . Number of children: Not on file  . Years of education: Not on file  . Highest education level: Not on file  Occupational History  . Not on file  Tobacco Use  . Smoking status: Never Smoker  . Smokeless tobacco: Never Used  Substance and Sexual Activity  . Alcohol use: Yes    Comment: occ  . Drug use: No  . Sexual activity: Yes    Birth  control/protection: None  Other Topics Concern  . Not on file  Social History Narrative  . Not on file   Social Determinants of Health   Financial Resource Strain: Not on file  Food Insecurity: Not on file  Transportation Needs: Not on file  Physical Activity: Not on file  Stress: Not on file  Social Connections: Not on file  Intimate Partner Violence: Not on file    Outpatient Medications Prior to Visit  Medication Sig Dispense Refill  . Ascorbic Acid (VITAMIN C) 1000 MG tablet Take 2,000 mg by mouth daily.    . Cholecalciferol (VITAMIN D3) 125 MCG (5000 UT) TABS Take 1 tablet by mouth daily.    . fluticasone (FLONASE) 50 MCG/ACT nasal spray Place 1 spray into both nostrils daily.    . Multiple Vitamin (MULTIVITAMIN) tablet Take 1 tablet by mouth daily.    . pantoprazole (PROTONIX) 40 MG tablet Take 1 tablet (40 mg total) by mouth daily. 30 tablet 1  . polyethylene glycol (MIRALAX / GLYCOLAX) packet Take 17 g by mouth daily.     No facility-administered medications prior to visit.    Allergies  Allergen Reactions  . Shellfish Allergy Hives and Shortness  Of Breath    Review of Systems  Constitutional: Negative for chills and fever.  HENT: Negative for congestion, sinus pressure, sinus pain and sore throat.   Eyes: Negative for pain and discharge.  Respiratory: Negative for cough and shortness of breath.   Cardiovascular: Positive for chest pain. Negative for palpitations.  Gastrointestinal: Negative for abdominal pain, constipation, diarrhea, nausea and vomiting.  Endocrine: Negative for polydipsia and polyuria.  Genitourinary: Negative for dysuria and hematuria.  Musculoskeletal: Negative for neck pain and neck stiffness.  Skin: Negative for rash.  Neurological: Negative for dizziness and weakness.  Psychiatric/Behavioral: Negative for agitation and behavioral problems.       Objective:    Physical Exam Vitals reviewed.  Constitutional:      General: She is not  in acute distress.    Appearance: She is not diaphoretic.  HENT:     Head: Normocephalic and atraumatic.     Nose: Nose normal.     Mouth/Throat:     Mouth: Mucous membranes are moist.  Eyes:     General: No scleral icterus.    Extraocular Movements: Extraocular movements intact.     Pupils: Pupils are equal, round, and reactive to light.  Cardiovascular:     Rate and Rhythm: Normal rate and regular rhythm.     Pulses: Normal pulses.     Heart sounds: Normal heart sounds. No murmur heard.   Pulmonary:     Breath sounds: Normal breath sounds. No wheezing or rales.  Abdominal:     Palpations: Abdomen is soft.     Tenderness: There is no abdominal tenderness.  Musculoskeletal:     Cervical back: Neck supple. No tenderness.     Right lower leg: No edema.     Left lower leg: No edema.  Skin:    General: Skin is warm.     Findings: No rash.  Neurological:     General: No focal deficit present.     Mental Status: She is alert and oriented to person, place, and time.  Psychiatric:        Mood and Affect: Mood normal.        Behavior: Behavior normal.     BP 132/87 (BP Location: Right Arm, Patient Position: Sitting, Cuff Size: Normal)   Pulse 91   Temp 99.2 F (37.3 C) (Oral)   Resp 18   Ht 5\' 3"  (1.6 m)   Wt 240 lb (108.9 kg)   LMP 01/25/2020   SpO2 99%   BMI 42.51 kg/m  Wt Readings from Last 3 Encounters:  02/13/20 240 lb (108.9 kg)  02/06/20 228 lb (103.4 kg)  10/10/19 215 lb (97.5 kg)    There are no preventive care reminders to display for this patient.  There are no preventive care reminders to display for this patient.   Lab Results  Component Value Date   TSH 1.49 08/06/2019   Lab Results  Component Value Date   WBC 8.5 08/06/2019   HGB 13.7 08/06/2019   HCT 42.8 08/06/2019   MCV 86.8 08/06/2019   PLT 467 (H) 08/06/2019   Lab Results  Component Value Date   NA 139 08/06/2019   K 4.6 08/06/2019   CO2 28 08/06/2019   GLUCOSE 102 (H) 08/06/2019    BUN 7 08/06/2019   CREATININE 0.88 08/06/2019   BILITOT 0.6 08/06/2019   ALKPHOS 69 06/05/2014   AST 17 08/06/2019   ALT 10 08/06/2019   PROT 7.5 08/06/2019   ALBUMIN 3.9 06/05/2014  CALCIUM 9.6 08/06/2019   Lab Results  Component Value Date   CHOL 143 08/06/2019   Lab Results  Component Value Date   HDL 61 08/06/2019   Lab Results  Component Value Date   LDLCALC 65 08/06/2019   Lab Results  Component Value Date   TRIG 88 08/06/2019   Lab Results  Component Value Date   CHOLHDL 2.3 08/06/2019   Lab Results  Component Value Date   HGBA1C 5.4 08/06/2019       Assessment & Plan:   Chest pain Likely related to GERD and/or anxiety EKG: SNR. HR 80. No signs of active ischemia.  GERD Advised to take Pantoprazole or Pepcid Avoid hot, spicy food  Morbid obesity Diet modification and moderate exercise advised Patient wants to start vegan diet again   No orders of the defined types were placed in this encounter.    Anabel Halon, MD

## 2020-07-09 ENCOUNTER — Ambulatory Visit: Payer: No Typology Code available for payment source | Admitting: Family Medicine

## 2020-08-31 ENCOUNTER — Ambulatory Visit: Payer: No Typology Code available for payment source | Admitting: Family Medicine

## 2020-09-11 ENCOUNTER — Ambulatory Visit (INDEPENDENT_AMBULATORY_CARE_PROVIDER_SITE_OTHER): Payer: No Typology Code available for payment source | Admitting: Family Medicine

## 2020-09-11 ENCOUNTER — Other Ambulatory Visit: Payer: Self-pay

## 2020-09-11 ENCOUNTER — Encounter: Payer: Self-pay | Admitting: Family Medicine

## 2020-09-11 VITALS — BP 128/74 | HR 70 | Temp 99.6°F | Resp 20 | Ht 63.0 in | Wt 236.0 lb

## 2020-09-11 DIAGNOSIS — Z1231 Encounter for screening mammogram for malignant neoplasm of breast: Secondary | ICD-10-CM

## 2020-09-11 DIAGNOSIS — R7302 Impaired glucose tolerance (oral): Secondary | ICD-10-CM

## 2020-09-11 DIAGNOSIS — M79622 Pain in left upper arm: Secondary | ICD-10-CM

## 2020-09-11 DIAGNOSIS — K219 Gastro-esophageal reflux disease without esophagitis: Secondary | ICD-10-CM

## 2020-09-11 DIAGNOSIS — Z1211 Encounter for screening for malignant neoplasm of colon: Secondary | ICD-10-CM | POA: Diagnosis not present

## 2020-09-11 DIAGNOSIS — Z9109 Other allergy status, other than to drugs and biological substances: Secondary | ICD-10-CM

## 2020-09-11 DIAGNOSIS — E559 Vitamin D deficiency, unspecified: Secondary | ICD-10-CM

## 2020-09-11 DIAGNOSIS — F419 Anxiety disorder, unspecified: Secondary | ICD-10-CM | POA: Diagnosis not present

## 2020-09-11 DIAGNOSIS — Z13220 Encounter for screening for lipoid disorders: Secondary | ICD-10-CM

## 2020-09-11 NOTE — Patient Instructions (Addendum)
F/u in 16 weeks, 4 months, call if you need me sooner  Please , call when ready, to schedule flu vaccine   Please schedule mammogram at checkout  You are referred to Dr Sydell Axon for screening colonoscopy  Please get fasting lipid, cmp and EGFr, hBA1c, TSh and vit D and cBC as soon as possible in the next 2 weeks  Continue to monitor arm pain  Use natural techniques of relaxation in the situations that make you anxious  Please provide 1200 and 1500 cal diet sheets at checkout  Weight loss goal of 10 to 15 pounds   It is important that you exercise regularly at least 30 minutes 5 times a week. If you develop chest pain, have severe difficulty breathing, or feel very tired, stop exercising immediately and seek medical attention

## 2020-09-14 ENCOUNTER — Encounter: Payer: Self-pay | Admitting: Family Medicine

## 2020-09-14 DIAGNOSIS — M79622 Pain in left upper arm: Secondary | ICD-10-CM | POA: Insufficient documentation

## 2020-09-14 NOTE — Assessment & Plan Note (Signed)
Intermittent left axillary pain, no palpable mass, monitoring currentl, may flare with menses, Gyne aware and is also following

## 2020-09-14 NOTE — Progress Notes (Signed)
   [name]     MRN: [id number]      DOB: [birth year]   HPI [name] is here for follow up and re-evaluation of chronic medical conditions, medication management and review of any available recent lab and radiology data.  Preventive health is updated, specifically  Cancer screening and Immunization. Choses to defer flu vaccine and get this when she is immunizinge her daughter  Questions or concerns regarding consultations or procedures which the PT has had in the interim are  addressed.c/o left armpit pain intermittently, denies any swelling or mass, recently discussed and evaluated by Gyne who advised she montor the pain with regard to her cycle as not sure if they are related Has not lost weight since last visit, has been exercising, needs to start calorie counting, no interest in medications, afraid of potential s/e, will w0rk on lifestyle  C/o anxiety esp as it relates to certain topics like covid infection , will work on behavior modification , no interest in or need for medicatio  ROS Denies recent fever or chills. Denies sinus pressure, nasal congestion, ear pain or sore throat. Denies chest congestion, productive cough or wheezing. Denies chest pains, palpitations and leg swelling Denies abdominal pain, nausea, vomiting,diarrhea or constipation.   Denies dysuria, frequency, hesitancy or incontinence. Denies joint pain, swelling and limitation in mobility. Denies headaches, seizures, numbness, or tingling. Denies skin break down or rash.   PE  BP 128/74 (BP Location: Right Arm, Patient Position: Sitting, Cuff Size: Large)   Pulse 70   Temp 99.6 F (37.6 C)   Resp 20   Ht 5\' 3"  (1.6 m)   Wt 236 lb (107 kg)   SpO2 98%   BMI 41.81 kg/m   Patient alert and oriented and in no cardiopulmonary distress.  HEENT: No facial asymmetry, EOMI,     Neck supple .  Chest: Clear to auscultation bilaterally.  CVS: S1, S2 no murmurs, no S3.Regular rate.  ABD: Soft non  tender.   Ext: No edema  MS: Adequate ROM spine, shoulders, hips and knees.  Skin: Intact, no ulcerations or rash noted.No axillary mass or adenopathy, non tender exam  Psych: Good eye contact, normal affect. Memory intact mildly anxious not  depressed appearing.  CNS: CN 2-12 intact, power,  normal throughout.no focal deficits noted.   Assessment & Plan  Axillary pain, left Intermittent left axillary pain, no palpable mass, monitoring currentl, may flare with menses, Gyne aware and is also following  Anxiety Mild to moderate anxiety, aggravated by specific concerns, like Covid, behavioral modification only at this time  Morbid obesity  Patient re-educated about  the importance of commitment to a  minimum of 150 minutes of exercise per week as able.  The importance of healthy food choices with portion control discussed, as well as eating regularly and within a 12 hour window most days. The need to choose "clean , green" food 50 to 75% of the time is discussed, as well as to make water the primary drink and set a goal of 64 ounces water daily.    Weight /BMI 09/11/2020 02/13/2020 02/06/2020  WEIGHT 236 lb 240 lb 228 lb  HEIGHT 5\' 3"  5\' 3"  5\' 3"   BMI 41.81 kg/m2 42.51 kg/m2 40.39 kg/m2      GERD Controlled, no change in medication   Environmental allergies Controlled, no change in medication

## 2020-09-14 NOTE — Assessment & Plan Note (Signed)
  Patient re-educated about  the importance of commitment to a  minimum of 150 minutes of exercise per week as able.  The importance of healthy food choices with portion control discussed, as well as eating regularly and within a 12 hour window most days. The need to choose "clean , green" food 50 to 75% of the time is discussed, as well as to make water the primary drink and set a goal of 64 ounces water daily.    Weight /BMI 09/11/2020 02/13/2020 02/06/2020  WEIGHT 236 lb 240 lb 228 lb  HEIGHT 5\' 3"  5\' 3"  5\' 3"   BMI 41.81 kg/m2 42.51 kg/m2 40.39 kg/m2

## 2020-09-14 NOTE — Assessment & Plan Note (Signed)
Mild to moderate anxiety, aggravated by specific concerns, like Covid, behavioral modification only at this time

## 2020-09-14 NOTE — Assessment & Plan Note (Signed)
Controlled, no change in medication  

## 2020-09-21 ENCOUNTER — Encounter: Payer: Self-pay | Admitting: Internal Medicine

## 2020-10-28 ENCOUNTER — Other Ambulatory Visit: Payer: Self-pay

## 2020-10-28 ENCOUNTER — Ambulatory Visit (INDEPENDENT_AMBULATORY_CARE_PROVIDER_SITE_OTHER): Payer: No Typology Code available for payment source

## 2020-10-28 DIAGNOSIS — Z23 Encounter for immunization: Secondary | ICD-10-CM

## 2020-11-04 ENCOUNTER — Ambulatory Visit (HOSPITAL_COMMUNITY)
Admission: RE | Admit: 2020-11-04 | Discharge: 2020-11-04 | Disposition: A | Discharge status: Home / Self Care | Payer: No Typology Code available for payment source | Source: Ambulatory Visit | Attending: Family Medicine | Admitting: Family Medicine

## 2020-11-04 ENCOUNTER — Other Ambulatory Visit: Payer: Self-pay

## 2020-11-04 DIAGNOSIS — Z1231 Encounter for screening mammogram for malignant neoplasm of breast: Secondary | ICD-10-CM | POA: Insufficient documentation

## 2020-11-10 ENCOUNTER — Ambulatory Visit: Payer: No Typology Code available for payment source

## 2020-11-10 ENCOUNTER — Encounter: Payer: Self-pay | Admitting: Family Medicine

## 2020-11-10 ENCOUNTER — Ambulatory Visit (INDEPENDENT_AMBULATORY_CARE_PROVIDER_SITE_OTHER): Payer: No Typology Code available for payment source | Admitting: Family Medicine

## 2020-11-10 ENCOUNTER — Other Ambulatory Visit: Payer: Self-pay

## 2020-11-10 DIAGNOSIS — Z20828 Contact with and (suspected) exposure to other viral communicable diseases: Secondary | ICD-10-CM | POA: Insufficient documentation

## 2020-11-10 NOTE — Progress Notes (Signed)
Virtual Visit via Telephone Note  I connected with [name] on 11/10/20 at 11:00 AM EST by telephone and verified that I am speaking with the correct person using two identifiers.  Location: Patient: home Provider: poffice   I discussed the limitations, risks, security and privacy concerns of performing an evaluation and management service by telephone and the availability of in person appointments. I also discussed with the patient that there may be a patient responsible charge related to this service. The patient expressed understanding and agreed to proceed.   History of Present Illness: Positive inflenza exposure, her 45 y/o daughter tested positive for flu on 11/07, [name] now has sore throat and  states she was advised to get prophylactic tamiflu   Observations/Objective: LMP 11/04/2020  Good communication with no confusion and intact memory. Alert and oriented x 3 No signs of respiratory distress during speech   Assessment and Plan:  Exposure to influenza Daughter dx with influenza , currentluy asymptomatic and requesting tamiflu, will screen if negative then no med  Follow Up Instructions:    I discussed the assessment and treatment plan with the patient. The patient was provided an opportunity to ask questions and all were answered. The patient agreed with the plan and demonstrated an understanding of the instructions.   The patient was advised to call back or seek an in-person evaluation if the symptoms worsen or if the condition fails to improve as anticipated.  I provided 7 minutes of non-face-to-face time during this encounter.   Syliva Overman, MD

## 2020-11-10 NOTE — Assessment & Plan Note (Signed)
Daughter dx with influenza , currentluy asymptomatic and requesting tamiflu, will screen if negative then no med

## 2020-11-10 NOTE — Patient Instructions (Signed)
F/U as before, call if you need me sooner  Come by the office for screening for influenza this afternoon after 1 pm  As discussed , no medication is being prescribed for you unless you also have a positive influenza test  Practice hand washing and use of face mask at home since there is an ill family member   Thanks for choosing [facility], we consider it a privelige to serve you.

## 2021-01-04 NOTE — H&P (View-Only) (Signed)
° °Referring Provider: Simpson, Margaret E, MD °Primary Care Physician:  Simpson, Margaret E, MD °Primary Gastroenterologist:  Dr. Rourk ° °Chief Complaint  °Patient presents with  ° Consult  °  TCS last done 2005 by [facility]. Was normal per pt.   ° ° °HPI:   °[name] is a 46 y.o. female presenting today at the request of Simpson, Margaret E, MD for consult colonoscopy.  ° °Reports TCS in 2005 with [facility] with normal exam. Reports this was completed due to problems with constipation.  She has chronic constipation that is now well controlled with MiraLAX daily.  Denies abdominal pain, BRBPR, melena, unintentional weight loss.  She has started following a vegan diet and exercising in efforts to lose weight.  She has history of occasional heartburn, fairly infrequent, associated with known triggers including spicy foods or drinking a glass of wine and uses Protonix as needed which is prescribed by Dr. Simpson.  Denies dysphagia, nausea, vomiting. ° °No Fhx of colon cancer or polyps.  ° °Past Medical History:  °Diagnosis Date  ° Allergy   ° late Spring/early Summer  ° Chronic constipation   ° GERD (gastroesophageal reflux disease)   ° Hx of varicella   ° Hyperglycemia   ° IBS (irritable bowel syndrome)   ° ° °Past Surgical History:  °Procedure Laterality Date  ° BREAST REDUCTION SURGERY    ° BREAST SURGERY Bilateral 01/04/2000  ° reduction  ° bunnionectomy Left 01/04/2004  ° COLONOSCOPY    ° Per patient, colonoscopy in 2005 with [facility] revealing normal exam.  ° right ankle    ° TONSILLECTOMY  01/03/2002  ° ° °Current Outpatient Medications  °Medication Sig Dispense Refill  ° Ascorbic Acid (VITAMIN C) 1000 MG tablet Take 2,000 mg by mouth daily.    ° Cholecalciferol (VITAMIN D3) 125 MCG (5000 UT) TABS Take 1 tablet by mouth daily.    ° fluticasone (FLONASE) 50 MCG/ACT nasal spray Place 1 spray into both nostrils daily.    ° Multiple Vitamin (MULTIVITAMIN) tablet Take 1 tablet by mouth daily.    °  pantoprazole (PROTONIX) 40 MG tablet Take 1 tablet (40 mg total) by mouth daily. (Patient taking differently: Take 40 mg by mouth daily as needed.) 30 tablet 1  ° polyethylene glycol (MIRALAX / GLYCOLAX) packet Take 17 g by mouth daily.    ° °No current facility-administered medications for this visit.  ° ° °Allergies as of 01/06/2021 - Review Complete 01/06/2021  °Allergen Reaction Noted  ° Shellfish allergy Hives and Shortness Of Breath 12/09/2011  ° ° °Family History  °Problem Relation Age of Onset  ° Hypothyroidism Mother   ° Hyperlipidemia Mother   ° Cancer Father   °     lung  ° Diabetes Father   ° Hypertension Sister   ° Diabetes Maternal Aunt   ° Hypertension Paternal Aunt   ° Diabetes Paternal Aunt   ° Colon cancer Neg Hx   ° Colon polyps Neg Hx   ° ° °Social History  ° °Socioeconomic History  ° Marital status: Single  °  Spouse name: Not on file  ° Number of children: Not on file  ° Years of education: Not on file  ° Highest education level: Not on file  °Occupational History  ° Not on file  °Tobacco Use  ° Smoking status: Never  ° Smokeless tobacco: Never  °Substance and Sexual Activity  ° Alcohol use: Yes  °  Comment: occ- 1-2 drinks a   month.   Drug use: No   Sexual activity: Yes    Birth control/protection: None  Other Topics Concern   Not on file  Social History Narrative   Not on file   Social Determinants of Health   Financial Resource Strain: Not on file  Food Insecurity: Not on file  Transportation Needs: Not on file  Physical Activity: Not on file  Stress: Not on file  Social Connections: Not on file  Intimate Partner Violence: Not on file    Review of Systems: Gen: Denies any fever, chills, cold or flulike symptoms, presyncope, syncope. CV: Denies chest pain, heart palpitations Resp: Denies shortness of breath or cough.  GI: See HPI GU : Denies urinary burning, urinary frequency, urinary hesitancy MS: Admits to intermittent knee pain. Derm: Denies rash. Psych: Denies  depression, anxiety. Heme:  See HPI  Physical Exam: BP 121/78    Pulse 70    Temp 97.7 F (36.5 C)    Ht 5\' 3"  (1.6 m)    Wt 234 lb 3.2 oz (106.2 kg)    LMP 12/28/2020    BMI 41.49 kg/m  General:   Alert and oriented. Pleasant and cooperative. Well-nourished and well-developed.  Head:  Normocephalic and atraumatic. Eyes:  Without icterus, sclera clear and conjunctiva pink.  Ears:  Normal auditory acuity. Lungs:  Clear to auscultation bilaterally. No wheezes, rales, or rhonchi. No distress.  Heart:  S1, S2 present without murmurs appreciated.  Abdomen:  +BS, soft, non-tender and non-distended. No HSM noted. No guarding or rebound. No masses appreciated.  Rectal:  Deferred  Msk:  Symmetrical without gross deformities. Normal posture.  Extremities:  Without edema. Neurologic:  Alert and  oriented x4;  grossly normal neurologically. Skin:  Intact without significant lesions or rashes. Psych: Normal mood and affect.    Assessment:  46 year old female with history of obesity, chronic constipation, occasional heartburn related to dietary triggers, presenting today at the request of Dr. 54 for colon cancer screening.  She reports her last colonoscopy was in 2005 with [facility] with normal exam.  States colonoscopy was pursued due to constipation which is now well controlled with MiraLAX daily.  No alarm symptoms.  No family history of colon cancer or polyps.    Plan:  Proceed with colonoscopy with Dr. 2006 in the near future. The risks, benefits, and alternatives have been discussed with the patient in detail. The patient states understanding and desires to proceed. ASA III due to BMI Request colonoscopy records from [facility]. Follow-up as needed   Jena Gauss [facility] 01/06/2021

## 2021-01-04 NOTE — Progress Notes (Signed)
Referring Provider: Kerri Perches, MD Primary Care Physician:  Kerri Perches, MD Primary Gastroenterologist:  Dr. Jena Gauss  Chief Complaint  Patient presents with   Consult    TCS last done 2005 by [facility]. Was normal per pt.     HPI:   [name] is a 46 y.o. female presenting today at the request of Kerri Perches, MD for consult colonoscopy.   Reports TCS in 2005 with [facility] with normal exam. Reports this was completed due to problems with constipation.  She has chronic constipation that is now well controlled with MiraLAX daily.  Denies abdominal pain, BRBPR, melena, unintentional weight loss.  She has started following a vegan diet and exercising in efforts to lose weight.  She has history of occasional heartburn, fairly infrequent, associated with known triggers including spicy foods or drinking a glass of wine and uses Protonix as needed which is prescribed by Dr. Lodema Hong.  Denies dysphagia, nausea, vomiting.  No Fhx of colon cancer or polyps.   Past Medical History:  Diagnosis Date   Allergy    late Spring/early Summer   Chronic constipation    GERD (gastroesophageal reflux disease)    Hx of varicella    Hyperglycemia    IBS (irritable bowel syndrome)     Past Surgical History:  Procedure Laterality Date   BREAST REDUCTION SURGERY     BREAST SURGERY Bilateral 01/04/2000   reduction   bunnionectomy Left 01/04/2004   COLONOSCOPY     Per patient, colonoscopy in 2005 with [facility] revealing normal exam.   right ankle     TONSILLECTOMY  01/03/2002    Current Outpatient Medications  Medication Sig Dispense Refill   Ascorbic Acid (VITAMIN C) 1000 MG tablet Take 2,000 mg by mouth daily.     Cholecalciferol (VITAMIN D3) 125 MCG (5000 UT) TABS Take 1 tablet by mouth daily.     fluticasone (FLONASE) 50 MCG/ACT nasal spray Place 1 spray into both nostrils daily.     Multiple Vitamin (MULTIVITAMIN) tablet Take 1 tablet by mouth daily.      pantoprazole (PROTONIX) 40 MG tablet Take 1 tablet (40 mg total) by mouth daily. (Patient taking differently: Take 40 mg by mouth daily as needed.) 30 tablet 1   polyethylene glycol (MIRALAX / GLYCOLAX) packet Take 17 g by mouth daily.     No current facility-administered medications for this visit.    Allergies as of 01/06/2021 - Review Complete 01/06/2021  Allergen Reaction Noted   Shellfish allergy Hives and Shortness Of Breath 12/09/2011    Family History  Problem Relation Age of Onset   Hypothyroidism Mother    Hyperlipidemia Mother    Cancer Father        lung   Diabetes Father    Hypertension Sister    Diabetes Maternal Aunt    Hypertension Paternal Aunt    Diabetes Paternal Aunt    Colon cancer Neg Hx    Colon polyps Neg Hx     Social History   Socioeconomic History   Marital status: Single    Spouse name: Not on file   Number of children: Not on file   Years of education: Not on file   Highest education level: Not on file  Occupational History   Not on file  Tobacco Use   Smoking status: Never   Smokeless tobacco: Never  Substance and Sexual Activity   Alcohol use: Yes    Comment: occ- 1-2 drinks a  month.   Drug use: No   Sexual activity: Yes    Birth control/protection: None  Other Topics Concern   Not on file  Social History Narrative   Not on file   Social Determinants of Health   Financial Resource Strain: Not on file  Food Insecurity: Not on file  Transportation Needs: Not on file  Physical Activity: Not on file  Stress: Not on file  Social Connections: Not on file  Intimate Partner Violence: Not on file    Review of Systems: Gen: Denies any fever, chills, cold or flulike symptoms, presyncope, syncope. CV: Denies chest pain, heart palpitations Resp: Denies shortness of breath or cough.  GI: See HPI GU : Denies urinary burning, urinary frequency, urinary hesitancy MS: Admits to intermittent knee pain. Derm: Denies rash. Psych: Denies  depression, anxiety. Heme:  See HPI  Physical Exam: BP 121/78    Pulse 70    Temp 97.7 F (36.5 C)    Ht 5\' 3"  (1.6 m)    Wt 234 lb 3.2 oz (106.2 kg)    LMP 12/28/2020    BMI 41.49 kg/m  General:   Alert and oriented. Pleasant and cooperative. Well-nourished and well-developed.  Head:  Normocephalic and atraumatic. Eyes:  Without icterus, sclera clear and conjunctiva pink.  Ears:  Normal auditory acuity. Lungs:  Clear to auscultation bilaterally. No wheezes, rales, or rhonchi. No distress.  Heart:  S1, S2 present without murmurs appreciated.  Abdomen:  +BS, soft, non-tender and non-distended. No HSM noted. No guarding or rebound. No masses appreciated.  Rectal:  Deferred  Msk:  Symmetrical without gross deformities. Normal posture.  Extremities:  Without edema. Neurologic:  Alert and  oriented x4;  grossly normal neurologically. Skin:  Intact without significant lesions or rashes. Psych: Normal mood and affect.    Assessment:  45 year old female with history of obesity, chronic constipation, occasional heartburn related to dietary triggers, presenting today at the request of Dr. 54 for colon cancer screening.  She reports her last colonoscopy was in 2005 with [facility] with normal exam.  States colonoscopy was pursued due to constipation which is now well controlled with MiraLAX daily.  No alarm symptoms.  No family history of colon cancer or polyps.    Plan:  Proceed with colonoscopy with Dr. 2006 in the near future. The risks, benefits, and alternatives have been discussed with the patient in detail. The patient states understanding and desires to proceed. ASA III due to BMI Request colonoscopy records from [facility]. Follow-up as needed   Jena Gauss [facility] Gastroenterology 01/06/2021

## 2021-01-06 ENCOUNTER — Ambulatory Visit: Payer: No Typology Code available for payment source | Admitting: Gastroenterology

## 2021-01-06 ENCOUNTER — Encounter: Payer: Self-pay | Admitting: Gastroenterology

## 2021-01-06 ENCOUNTER — Other Ambulatory Visit: Payer: Self-pay

## 2021-01-06 VITALS — BP 121/78 | HR 70 | Temp 97.7°F | Ht 63.0 in | Wt 234.2 lb

## 2021-01-06 DIAGNOSIS — Z1211 Encounter for screening for malignant neoplasm of colon: Secondary | ICD-10-CM | POA: Insufficient documentation

## 2021-01-06 MED ORDER — PEG 3350-KCL-NA BICARB-NACL 420 G PO SOLR: 4000.0000 mL | ORAL | 0 refills | Status: DC

## 2021-01-06 NOTE — Patient Instructions (Signed)
We will arrange for you to have a colonoscopy in the near future with Dr. Jena Gauss.  We will follow-up with you in the office as needed.  Do not hesitate to call if you have any new GI concerns.  It was a pleasure meeting you today!  Ermalinda Memos, PA-C [facility] Gastroenterology

## 2021-01-07 ENCOUNTER — Other Ambulatory Visit: Payer: Self-pay

## 2021-01-07 ENCOUNTER — Telehealth: Payer: Self-pay

## 2021-01-07 NOTE — Telephone Encounter (Signed)
Called [facility], spoke to [place], no PA needed for TCS. Ref# [id number].

## 2021-01-13 ENCOUNTER — Ambulatory Visit: Payer: No Typology Code available for payment source | Admitting: Family Medicine

## 2021-01-20 ENCOUNTER — Other Ambulatory Visit: Payer: Self-pay

## 2021-01-20 ENCOUNTER — Encounter (HOSPITAL_COMMUNITY): Payer: Self-pay | Admitting: Internal Medicine

## 2021-01-20 ENCOUNTER — Encounter (HOSPITAL_COMMUNITY)
Admission: RE | Disposition: A | Discharge status: Home / Self Care | Payer: Self-pay | Source: Home / Self Care | Attending: Internal Medicine

## 2021-01-20 ENCOUNTER — Ambulatory Visit (HOSPITAL_COMMUNITY)
Admission: RE | Admit: 2021-01-20 | Discharge: 2021-01-20 | Disposition: A | Discharge status: Home / Self Care | Payer: No Typology Code available for payment source | Attending: Internal Medicine | Admitting: Internal Medicine

## 2021-01-20 DIAGNOSIS — Z1211 Encounter for screening for malignant neoplasm of colon: Secondary | ICD-10-CM | POA: Diagnosis present

## 2021-01-20 DIAGNOSIS — Z79899 Other long term (current) drug therapy: Secondary | ICD-10-CM | POA: Insufficient documentation

## 2021-01-20 DIAGNOSIS — K5909 Other constipation: Secondary | ICD-10-CM | POA: Diagnosis not present

## 2021-01-20 DIAGNOSIS — E669 Obesity, unspecified: Secondary | ICD-10-CM | POA: Diagnosis not present

## 2021-01-20 HISTORY — PX: COLONOSCOPY: SHX5424

## 2021-01-20 SURGERY — COLONOSCOPY
Anesthesia: Moderate Sedation

## 2021-01-20 MED ORDER — SODIUM CHLORIDE 0.9 % IV SOLN: INTRAVENOUS | Status: DC

## 2021-01-20 MED ORDER — MEPERIDINE HCL 50 MG/ML IJ SOLN
INTRAMUSCULAR | Status: AC
  Filled 2021-01-20: qty 1

## 2021-01-20 MED ORDER — MEPERIDINE HCL 100 MG/ML IJ SOLN
INTRAMUSCULAR | Status: DC | PRN
  Administered 2021-01-20: 10 mg via INTRAVENOUS
  Administered 2021-01-20: 40 mg via INTRAVENOUS

## 2021-01-20 MED ORDER — ONDANSETRON HCL 4 MG/2ML IJ SOLN
INTRAMUSCULAR | Status: AC
  Filled 2021-01-20: qty 2

## 2021-01-20 MED ORDER — ONDANSETRON HCL 4 MG/2ML IJ SOLN
INTRAMUSCULAR | Status: DC | PRN
  Administered 2021-01-20: 4 mg via INTRAVENOUS

## 2021-01-20 MED ORDER — MIDAZOLAM HCL 5 MG/5ML IJ SOLN
INTRAMUSCULAR | Status: DC | PRN
  Administered 2021-01-20 (×2): 1 mg via INTRAVENOUS
  Administered 2021-01-20 (×2): 2 mg via INTRAVENOUS

## 2021-01-20 MED ORDER — MIDAZOLAM HCL 5 MG/5ML IJ SOLN
INTRAMUSCULAR | Status: AC
  Filled 2021-01-20: qty 10

## 2021-01-20 NOTE — Discharge Instructions (Addendum)
Colonoscopy Discharge Instructions  Read the instructions outlined below and refer to this sheet in the next few weeks. These discharge instructions provide you with general information on caring for yourself after you leave the hospital. Your doctor may also give you specific instructions. While your treatment has been planned according to the most current medical practices available, unavoidable complications occasionally occur. If you have any problems or questions after discharge, call Dr. Jena Gauss at [phone]. ACTIVITY You may resume your regular activity, but move at a slower pace for the next 24 hours.  Take frequent rest periods for the next 24 hours.  Walking will help get rid of the air and reduce the bloated feeling in your belly (abdomen).  No driving for 24 hours (because of the medicine (anesthesia) used during the test).   Do not sign any important legal documents or operate any machinery for 24 hours (because of the anesthesia used during the test).  NUTRITION Drink plenty of fluids.  You may resume your normal diet as instructed by your doctor.  Begin with a light meal and progress to your normal diet. Heavy or fried foods are harder to digest and may make you feel sick to your stomach (nauseated).  Avoid alcoholic beverages for 24 hours or as instructed.  MEDICATIONS You may resume your normal medications unless your doctor tells you otherwise.  WHAT YOU CAN EXPECT TODAY Some feelings of bloating in the abdomen.  Passage of more gas than usual.  Spotting of blood in your stool or on the toilet paper.  IF YOU HAD POLYPS REMOVED DURING THE COLONOSCOPY: No aspirin products for 7 days or as instructed.  No alcohol for 7 days or as instructed.  Eat a soft diet for the next 24 hours.  FINDING OUT THE RESULTS OF YOUR TEST Not all test results are available during your visit. If your test results are not back during the visit, make an appointment with your caregiver to find out the  results. Do not assume everything is normal if you have not heard from your caregiver or the medical facility. It is important for you to follow up on all of your test results.  SEEK IMMEDIATE MEDICAL ATTENTION IF: You have more than a spotting of blood in your stool.  Your belly is swollen (abdominal distention).  You are nauseated or vomiting.  You have a temperature over 101.  You have abdominal pain or discomfort that is severe or gets worse throughout the day.    Your colonoscopy appeared normal today.  It is recommended you return in 10 years for average risk screening examination  At patient request, I called Lola Riches at [phone].  Call rolled to voicemail.  Left a message.    PATIENT INSTRUCTIONS POST-ANESTHESIA  IMMEDIATELY FOLLOWING SURGERY:  Do not drive or operate machinery for the first twenty four hours after surgery.  Do not make any important decisions for twenty four hours after surgery or while taking narcotic pain medications or sedatives.  If you develop intractable nausea and vomiting or a severe headache please notify your doctor immediately.  FOLLOW-UP:  Please make an appointment with your surgeon as instructed. You do not need to follow up with anesthesia unless specifically instructed to do so.  WOUND CARE INSTRUCTIONS (if applicable):  Keep a dry clean dressing on the anesthesia/puncture wound site if there is drainage.  Once the wound has quit draining you may leave it open to air.  Generally you should leave the bandage intact  for twenty four hours unless there is drainage.  If the epidural site drains for more than 36-48 hours please call the anesthesia department.  QUESTIONS?:  Please feel free to call your physician or the hospital operator if you have any questions, and they will be happy to assist you.

## 2021-01-20 NOTE — Op Note (Signed)
[facility] Patient Name: Susan PummelShelitha Jane Procedure Date: 01/20/2021 9:04 AM MRN: [id number] Date of Birth: 05/19/1975 Attending MD: Gennette Pacobert Michael Mallorie Norrod , MD CSN: [id number] Age: 46 Admit Type: Outpatient Procedure:                Colonoscopy Indications:              Screening for colorectal malignant neoplasm Providers:                Gennette Pacobert Michael Ruvim Risko, MD, Angelica RanSusan Goss, Pandora LeiterNeville                            David, Technician Referring MD:              Medicines:                Midazolam 6 mg IV, Meperidine 50 mg IV Complications:            No immediate complications. Estimated Blood Loss:     Estimated blood loss: none. Procedure:                Pre-Anesthesia Assessment:                           - Prior to the procedure, a History and Physical                            was performed, and patient medications and                            allergies were reviewed. The patient's tolerance of                            previous anesthesia was also reviewed. The risks                            and benefits of the procedure and the sedation                            options and risks were discussed with the patient.                            All questions were answered, and informed consent                            was obtained. Prior Anticoagulants: The patient has                            taken no previous anticoagulant or antiplatelet                            agents. ASA Grade Assessment: III - A patient with                            severe systemic disease. After reviewing the risks  and benefits, the patient was deemed in                            satisfactory condition to undergo the procedure.                           After obtaining informed consent, the colonoscope                            was passed under direct vision. Throughout the                            procedure, the patient's blood pressure, pulse, and                             oxygen saturations were monitored continuously. The                            [id number] scope was introduced through the                            anus and advanced to the the cecum, identified by                            appendiceal orifice and ileocecal valve. The                            colonoscopy was performed without difficulty. The                            patient tolerated the procedure well. The quality                            of the bowel preparation was adequate. Scope In: 9:25:17 AM Scope Out: 9:39:07 AM Scope Withdrawal Time: 0 hours 7 minutes 5 seconds  Total Procedure Duration: 0 hours 13 minutes 50 seconds  Findings:      The perianal and digital rectal examinations were normal. Colon somewhat       redundant. External abdominal pressure required to reach the cecum.      The colon (entire examined portion) appeared normal.      The retroflexed view of the distal rectum and anal verge was normal and       showed no anal or rectal abnormalities. Impression:               - The entire examined colon is normal (redundant).                           - The distal rectum and anal verge are normal on                            retroflexion view.                           - No specimens collected. Moderate Sedation:  Moderate (conscious) sedation was administered by the endoscopy nurse       and supervised by the endoscopist. The following parameters were       monitored: oxygen saturation, heart rate, blood pressure, respiratory       rate, EKG, adequacy of pulmonary ventilation, and response to care. Recommendation:           - Patient has a contact number available for                            emergencies. The signs and symptoms of potential                            delayed complications were discussed with the                            patient. Return to normal activities tomorrow.                            Written discharge  instructions were provided to the                            patient.                           - Resume previous diet.                           - Continue present medications.                           - Await pathology results.                           - Repeat colonoscopy in 10 years for screening                            purposes.                           - Return to GI office (date not yet determined). Procedure Code(s):        --- Professional ---                           [phone], Colonoscopy, flexible; diagnostic, including                            collection of specimen(s) by brushing or washing,                            when performed (separate procedure) Diagnosis Code(s):        --- Professional ---                           Z12.11, Encounter for screening for malignant  neoplasm of colon CPT copyright 2019 American Medical Association. All rights reserved. The codes documented in this report are preliminary and upon coder review may  be revised to meet current compliance requirements. Gerrit Friendsobert M. Elainah Rhyne, MD Gennette Pacobert Michael Zimere Dunlevy, MD 01/20/2021 9:50:18 AM This report has been signed electronically. Number of Addenda: 0

## 2021-01-20 NOTE — Interval H&P Note (Signed)
History and Physical Interval Note:  01/20/2021 9:09 AM  [name]  has presented today for surgery, with the diagnosis of colon cancer screening.  The various methods of treatment have been discussed with the patient and family. After consideration of risks, benefits and other options for treatment, the patient has consented to  Procedure(s) with comments: COLONOSCOPY (N/A) - 9:30am as a surgical intervention.  The patient's history has been reviewed, patient examined, no change in status, stable for surgery.  I have reviewed the patient's chart and labs.  Questions were answered to the patient's satisfaction.     Allard Lightsey   no change.  Average rescreening colonoscopy per plan.  The risks, benefits, limitations, alternatives and imponderables have been reviewed with the patient. Questions have been answered. All parties are agreeable.

## 2021-01-22 ENCOUNTER — Encounter (HOSPITAL_COMMUNITY): Payer: Self-pay | Admitting: Internal Medicine

## 2021-01-22 ENCOUNTER — Telehealth: Payer: Self-pay | Admitting: Gastroenterology

## 2021-01-22 NOTE — Telephone Encounter (Signed)
Received and reviewed records from [facility] for digestive diseases.  Flexible sigmoidoscopy 04/16/2003 with normal exam.  No additional recommendations at this time.  Records were requested for database completeness.  She recently underwent colonoscopy that was also normal with recommendations to repeat in 10 years.

## 2021-05-25 ENCOUNTER — Encounter: Payer: Self-pay | Admitting: Family Medicine

## 2021-05-25 ENCOUNTER — Ambulatory Visit: Payer: No Typology Code available for payment source | Admitting: Family Medicine

## 2021-05-25 VITALS — BP 124/85 | HR 75 | Ht 63.0 in | Wt 229.0 lb

## 2021-05-25 DIAGNOSIS — E785 Hyperlipidemia, unspecified: Secondary | ICD-10-CM | POA: Diagnosis not present

## 2021-05-25 DIAGNOSIS — E559 Vitamin D deficiency, unspecified: Secondary | ICD-10-CM

## 2021-05-25 DIAGNOSIS — R7302 Impaired glucose tolerance (oral): Secondary | ICD-10-CM

## 2021-05-25 DIAGNOSIS — I889 Nonspecific lymphadenitis, unspecified: Secondary | ICD-10-CM | POA: Diagnosis not present

## 2021-05-25 DIAGNOSIS — Z1231 Encounter for screening mammogram for malignant neoplasm of breast: Secondary | ICD-10-CM

## 2021-05-25 DIAGNOSIS — B839 Helminthiasis, unspecified: Secondary | ICD-10-CM

## 2021-05-25 MED ORDER — EMVERM 100 MG PO CHEW: 100.0000 mg | CHEWABLE_TABLET | Freq: Once | ORAL | 0 refills | Status: AC

## 2021-05-25 MED ORDER — CEPHALEXIN 500 MG PO CAPS: 500.0000 mg | ORAL_CAPSULE | Freq: Four times a day (QID) | ORAL | 0 refills | Status: DC

## 2021-05-25 NOTE — Assessment & Plan Note (Signed)
Antibiotic, keflex

## 2021-05-25 NOTE — Progress Notes (Signed)
   [name]     MRN: [id number]      DOB: October 11, 1975   HPI [name] is here for follow up and re-evaluation of chronic medical conditions, medication management and review of any available recent lab and radiology data.  Preventive health is updated, specifically  Cancer screening and Immunization.   Questions or concerns regarding consultations or procedures which the PT has had in the interim are  addressed. The PT denies any adverse reactions to current medications since the last visit.  C/o tender swelling in groin C/o worm infesttion ROS Denies recent fever or chills. Denies sinus pressure, nasal congestion, ear pain or sore throat. Denies chest congestion, productive cough or wheezing. Denies chest pains, palpitations and leg swelling Denies abdominal pain, nausea, vomiting,diarrhea or constipation.   Denies dysuria, frequency, hesitancy or incontinence. Denies joint pain, swelling and limitation in mobility. Denies headaches, seizures, numbness, or tingling. Denies depression, anxiety or insomnia. Denies skin break down or rash.   PE  BP 124/85   Pulse 75   Ht 5\' 3"  (1.6 m)   Wt 229 lb 0.6 oz (103.9 kg)   LMP 05/22/2021 (Exact Date)   SpO2 99%   BMI 40.57 kg/m   Patient alert and oriented and in no cardiopulmonary distress.  HEENT: No facial asymmetry, EOMI,     Neck supple .  Chest: Clear to auscultation bilaterally.  CVS: S1, S2 no murmurs, no S3.Regular rate.  ABD: Soft non tender. Tender right inguinal lymphadenopathy, dime sized  Ext: No edema  MS: Adequate ROM spine, shoulders, hips and knees.  Skin: Intact, no ulcerations or rash noted.  Psych: Good eye contact, normal affect. Memory intact not anxious or depressed appearing.  CNS: CN 2-12 intact, power,  normal throughout.no focal deficits noted.   Assessment & Plan  Adenitis Antibiotic, keflex  Morbid obesity  Patient re-educated about  the importance of commitment to a   minimum of 150 minutes of exercise per week as able. Plans to increase exercise commitment  The importance of healthy food choices with portion control discussed, as well as eating regularly and within a 12 hour window most days. The need to choose "clean , green" food 50 to 75% of the time is discussed, as well as to make water the primary drink and set a goal of 64 ounces water daily.       05/25/2021    8:32 AM 01/06/2021    8:32 AM 09/11/2020    3:29 PM  Weight /BMI  Weight 229 lb 0.6 oz 234 lb 3.2 oz 236 lb  Height 5\' 3"  (1.6 m) 5\' 3"  (1.6 m) 5\' 3"  (1.6 m)  BMI 40.57 kg/m2 41.49 kg/m2 41.81 kg/m2      Worm infestation Mebendazole 100 mg x 2 tabs prescribed  Dyslipidemia Hyperlipidemia:Low fat diet discussed and encouraged.   Lipid Panel  Lab Results  Component Value Date   CHOL 177 05/25/2021   HDL 55 05/25/2021   LDLCALC 105 (H) 05/25/2021   TRIG 96 05/25/2021   CHOLHDL 3.2 05/25/2021     Needs to reduce fat intake

## 2021-05-25 NOTE — Patient Instructions (Signed)
Follow-up in 6 months call if you need me sooner.  Keflex is prescribed for 1 week for inflamed lymph node.  Please schedule mammogram at checkout.  Commit to regular exercise intermittent fasting with 2 pound per month weight loss goal in mind.  Medication will be prescribed for warm infestation.  Fasting CBC lipid CMP and EGFR TSH HbA1c and vitamin D today.  Thanks for choosing [facility], we consider it a privelige to serve you.

## 2021-05-26 ENCOUNTER — Encounter: Payer: Self-pay | Admitting: Family Medicine

## 2021-05-26 LAB — LIPID PANEL
Chol/HDL Ratio: 3.2 ratio (ref 0.0–4.4)
Cholesterol, Total: 177 mg/dL (ref 100–199)
HDL: 55 mg/dL (ref >39)
LDL Chol Calc (NIH): 105 mg/dL — ABNORMAL HIGH (ref 0–99)
Triglycerides: 96 mg/dL (ref 0–149)
VLDL Cholesterol Cal: 17 mg/dL (ref 5–40)

## 2021-05-26 LAB — CBC
Hematocrit: 39 % (ref 34.0–46.6)
Hemoglobin: 12.5 g/dL (ref 11.1–15.9)
MCH: 27.2 pg (ref 26.6–33.0)
MCHC: 32.1 g/dL (ref 31.5–35.7)
MCV: 85 fL (ref 79–97)
Platelets: 417 10*3/uL (ref 150–450)
RBC: 4.6 x10E6/uL (ref 3.77–5.28)
RDW: 13.1 % (ref 11.7–15.4)
WBC: 8.5 10*3/uL (ref 3.4–10.8)

## 2021-05-26 LAB — CMP14+EGFR
ALT: 9 IU/L (ref 0–32)
AST: 18 IU/L (ref 0–40)
Albumin/Globulin Ratio: 1.6 (ref 1.2–2.2)
Albumin: 4.1 g/dL (ref 3.8–4.8)
Alkaline Phosphatase: 66 IU/L (ref 44–121)
BUN/Creatinine Ratio: 11 (ref 9–23)
BUN: 9 mg/dL (ref 6–24)
Bilirubin Total: 0.4 mg/dL (ref 0.0–1.2)
CO2: 27 mmol/L (ref 20–29)
Calcium: 9.2 mg/dL (ref 8.7–10.2)
Chloride: 101 mmol/L (ref 96–106)
Creatinine, Ser: 0.84 mg/dL (ref 0.57–1.00)
Globulin, Total: 2.5 g/dL (ref 1.5–4.5)
Glucose: 97 mg/dL (ref 70–99)
Potassium: 4.9 mmol/L (ref 3.5–5.2)
Sodium: 138 mmol/L (ref 134–144)
Total Protein: 6.6 g/dL (ref 6.0–8.5)
eGFR: 87 mL/min/{1.73_m2} (ref >59)

## 2021-05-26 LAB — TSH: TSH: 1.83 u[IU]/mL (ref 0.450–4.500)

## 2021-05-26 LAB — HEMOGLOBIN A1C
Est. average glucose Bld gHb Est-mCnc: 114 mg/dL
Hgb A1c MFr Bld: 5.6 % (ref 4.8–5.6)

## 2021-05-26 LAB — VITAMIN D 25 HYDROXY (VIT D DEFICIENCY, FRACTURES): Vit D, 25-Hydroxy: 41 ng/mL (ref 30.0–100.0)

## 2021-05-27 ENCOUNTER — Telehealth: Payer: Self-pay | Admitting: Family Medicine

## 2021-05-27 MED ORDER — MEBENDAZOLE 100 MG PO CHEW: CHEWABLE_TABLET | ORAL | 0 refills | Status: DC

## 2021-05-27 NOTE — Telephone Encounter (Signed)
Additional  tab sent

## 2021-05-31 ENCOUNTER — Encounter: Payer: Self-pay | Admitting: Family Medicine

## 2021-05-31 DIAGNOSIS — E785 Hyperlipidemia, unspecified: Secondary | ICD-10-CM | POA: Insufficient documentation

## 2021-05-31 NOTE — Assessment & Plan Note (Addendum)
  Patient re-educated about  the importance of commitment to a  minimum of 150 minutes of exercise per week as able. Plans to increase exercise commitment  The importance of healthy food choices with portion control discussed, as well as eating regularly and within a 12 hour window most days. The need to choose "clean , green" food 50 to 75% of the time is discussed, as well as to make water the primary drink and set a goal of 64 ounces water daily.       05/25/2021    8:32 AM 01/06/2021    8:32 AM 09/11/2020    3:29 PM  Weight /BMI  Weight 229 lb 0.6 oz 234 lb 3.2 oz 236 lb  Height 5\' 3"  (1.6 m) 5\' 3"  (1.6 m) 5\' 3"  (1.6 m)  BMI 40.57 kg/m2 41.49 kg/m2 41.81 kg/m2

## 2021-05-31 NOTE — Assessment & Plan Note (Signed)
Hyperlipidemia:Low fat diet discussed and encouraged.   Lipid Panel  Lab Results  Component Value Date   CHOL 177 05/25/2021   HDL 55 05/25/2021   LDLCALC 105 (H) 05/25/2021   TRIG 96 05/25/2021   CHOLHDL 3.2 05/25/2021     Needs to reduce fat intake

## 2021-05-31 NOTE — Assessment & Plan Note (Signed)
Mebendazole 100 mg x 2 tabs prescribed

## 2021-10-20 ENCOUNTER — Ambulatory Visit (INDEPENDENT_AMBULATORY_CARE_PROVIDER_SITE_OTHER): Payer: No Typology Code available for payment source

## 2021-10-20 DIAGNOSIS — Z23 Encounter for immunization: Secondary | ICD-10-CM | POA: Diagnosis not present

## 2021-11-02 ENCOUNTER — Inpatient Hospital Stay
Admission: RE | Admit: 2021-11-02 | Discharge: 2021-11-02 | Disposition: A | Discharge status: Home / Self Care | Payer: Self-pay | Source: Ambulatory Visit | Attending: Family Medicine | Admitting: Family Medicine

## 2021-11-02 ENCOUNTER — Other Ambulatory Visit: Payer: Self-pay | Admitting: Family Medicine

## 2021-11-02 DIAGNOSIS — Z1231 Encounter for screening mammogram for malignant neoplasm of breast: Secondary | ICD-10-CM

## 2021-11-08 ENCOUNTER — Ambulatory Visit (HOSPITAL_COMMUNITY)
Admission: RE | Admit: 2021-11-08 | Discharge: 2021-11-08 | Disposition: A | Discharge status: Home / Self Care | Payer: No Typology Code available for payment source | Source: Ambulatory Visit | Attending: Family Medicine | Admitting: Family Medicine

## 2021-11-08 DIAGNOSIS — Z1231 Encounter for screening mammogram for malignant neoplasm of breast: Secondary | ICD-10-CM | POA: Diagnosis present

## 2021-11-30 ENCOUNTER — Encounter: Payer: Self-pay | Admitting: Family Medicine

## 2021-11-30 ENCOUNTER — Ambulatory Visit (INDEPENDENT_AMBULATORY_CARE_PROVIDER_SITE_OTHER): Payer: No Typology Code available for payment source | Admitting: Family Medicine

## 2021-11-30 VITALS — BP 132/84 | HR 86 | Ht 63.0 in | Wt 227.0 lb

## 2021-11-30 DIAGNOSIS — E785 Hyperlipidemia, unspecified: Secondary | ICD-10-CM | POA: Diagnosis not present

## 2021-11-30 DIAGNOSIS — E559 Vitamin D deficiency, unspecified: Secondary | ICD-10-CM

## 2021-11-30 DIAGNOSIS — Z9109 Other allergy status, other than to drugs and biological substances: Secondary | ICD-10-CM | POA: Diagnosis not present

## 2021-11-30 DIAGNOSIS — K219 Gastro-esophageal reflux disease without esophagitis: Secondary | ICD-10-CM

## 2021-11-30 MED ORDER — SEMAGLUTIDE-WEIGHT MANAGEMENT 0.25 MG/0.5ML ~~LOC~~ SOAJ: 0.2500 mg | SUBCUTANEOUS | 0 refills | Status: AC

## 2021-11-30 NOTE — Patient Instructions (Signed)
F/U in 8 weeks, call if you need me before  Fasting CBC, lipid, cmp and EGFr,  TSH and vit D  New med for weight loss is prescribed   Wegovy injection prescribed  for weight loss, if not covered then I will prescribe half phentermine daily   Thanks for choosing [facility], we consider it a privelige to serve you.

## 2021-11-30 NOTE — Assessment & Plan Note (Signed)
Hyperlipidemia:Low fat diet discussed and encouraged.   Lipid Panel  Lab Results  Component Value Date   CHOL 177 05/25/2021   HDL 55 05/25/2021   LDLCALC 105 (H) 05/25/2021   TRIG 96 05/25/2021   CHOLHDL 3.2 05/25/2021     Updated lab needed at/ before next visit. Needs to lower fat intake

## 2021-11-30 NOTE — Assessment & Plan Note (Signed)
Primarily asymptomatic , has not taken medication for over 6 weeks, will d/c

## 2021-11-30 NOTE — Assessment & Plan Note (Signed)
Controlled, no change in medication  

## 2021-11-30 NOTE — Assessment & Plan Note (Signed)
  Patient re-educated about  the importance of commitment to a  minimum of 150 minutes of exercise per week as able.  The importance of healthy food choices with portion control discussed, as well as eating regularly and within a 12 hour window most days. The need to choose "clean , green" food 50 to 75% of the time is discussed, as well as to make water the primary drink and set a goal of 64 ounces water daily.       11/30/2021    4:11 PM 05/25/2021    8:32 AM 01/06/2021    8:32 AM  Weight /BMI  Weight 227 lb 229 lb 0.6 oz 234 lb 3.2 oz  Height 5\' 3"  (1.6 m) 5\' 3"  (1.6 m) 5\' 3"  (1.6 m)  BMI 40.21 kg/m2 40.57 kg/m2 41.49 kg/m2    Trial of wegovy or phentetermine or alternate med, also commitment to change in food choce and eating habits

## 2021-11-30 NOTE — Assessment & Plan Note (Signed)
Corrected when last cheked Updated lab needed at/ before next visit.

## 2021-11-30 NOTE — Progress Notes (Signed)
   [name]     MRN: [id number]      DOB: 1975/09/04   HPI [name] is here for follow up and re-evaluation of chronic medical conditions, medication management and review of any available recent lab and radiology data.  Preventive health is updated, specifically  Cancer screening is reviewed and is up to date , undecided about covid vaccine, states she got sick in the past with the vaccine C/o lack of weight loss and excessive weight , now at the stage where she is interested in and requesting medication to help with thi, which she has resisted in the past ROS Denies recent fever or chills. Denies sinus pressure, nasal congestion, ear pain or sore throat. Denies chest congestion, productive cough or wheezing. Denies chest pains, palpitations and leg swelling Denies abdominal pain, nausea, vomiting,diarrhea or constipation.   Denies dysuria, frequency, hesitancy or incontinence. Denies joint pain, swelling and limitation in mobility. Denies headaches, seizures, numbness, or tingling. Denies depression, anxiety or insomnia. Denies skin break down or rash.   PE  BP 132/84   Pulse 86   Ht 5\' 3"  (1.6 m)   Wt 227 lb (103 kg)   LMP 11/24/2021 (Exact Date)   SpO2 98%   Breastfeeding No   BMI 40.21 kg/m   Patient alert and oriented and in no cardiopulmonary distress.  HEENT: No facial asymmetry, EOMI,     Neck supple .  Chest: Clear to auscultation bilaterally.  CVS: S1, S2 no murmurs, no S3.Regular rate.  ABD: Soft non tender.   Ext: No edema  MS: Adequate ROM spine, shoulders, hips and knees.  Skin: Intact, no ulcerations or rash noted.  Psych: Good eye contact, normal affect. Memory intact not anxious or depressed appearing.  CNS: CN 2-12 intact, power,  normal throughout.no focal deficits noted.   Assessment & Plan  Morbid obesity  Patient re-educated about  the importance of commitment to a  minimum of 150 minutes of exercise per week as  able.  The importance of healthy food choices with portion control discussed, as well as eating regularly and within a 12 hour window most days. The need to choose "clean , green" food 50 to 75% of the time is discussed, as well as to make water the primary drink and set a goal of 64 ounces water daily.       11/30/2021    4:11 PM 05/25/2021    8:32 AM 01/06/2021    8:32 AM  Weight /BMI  Weight 227 lb 229 lb 0.6 oz 234 lb 3.2 oz  Height 5\' 3"  (1.6 m) 5\' 3"  (1.6 m) 5\' 3"  (1.6 m)  BMI 40.21 kg/m2 40.57 kg/m2 41.49 kg/m2    Trial of wegovy or phentetermine or alternate med, also commitment to change in food choce and eating habits  Vitamin D deficiency Corrected when last cheked Updated lab needed at/ before next visit.   Dyslipidemia Hyperlipidemia:Low fat diet discussed and encouraged.   Lipid Panel  Lab Results  Component Value Date   CHOL 177 05/25/2021   HDL 55 05/25/2021   LDLCALC 105 (H) 05/25/2021   TRIG 96 05/25/2021   CHOLHDL 3.2 05/25/2021     Updated lab needed at/ before next visit. Needs to lower fat intake  Environmental allergies Controlled, no change in medication   GERD (gastroesophageal reflux disease) Primarily asymptomatic , has not taken medication for over 6 weeks, will d/c

## 2021-12-17 ENCOUNTER — Ambulatory Visit (INDEPENDENT_AMBULATORY_CARE_PROVIDER_SITE_OTHER): Payer: No Typology Code available for payment source | Admitting: Internal Medicine

## 2021-12-17 ENCOUNTER — Encounter: Payer: Self-pay | Admitting: Internal Medicine

## 2021-12-17 DIAGNOSIS — B9689 Other specified bacterial agents as the cause of diseases classified elsewhere: Secondary | ICD-10-CM

## 2021-12-17 DIAGNOSIS — J019 Acute sinusitis, unspecified: Secondary | ICD-10-CM | POA: Diagnosis not present

## 2021-12-17 MED ORDER — AMOXICILLIN 875 MG PO TABS: 875.0000 mg | ORAL_TABLET | Freq: Two times a day (BID) | ORAL | 0 refills | Status: AC

## 2021-12-17 NOTE — Progress Notes (Signed)
   Acute Telephone Visit  Virtual Visit via Telephone Note  I connected with [name] on 12/17/21 at  8:00 AM EST by telephone and verified that I am speaking with the correct person using two identifiers.  Location: Patient: [address] [place], [place] [address] Provider: [phone] [address]. [address], [place], [place] [address]   I discussed the limitations, risks, security and privacy concerns of performing an evaluation and management service by telephone and the availability of in person appointments. I also discussed with the patient that there may be a patient responsible charge related to this service. The patient expressed understanding and agreed to proceed.  Chief Complaint  Patient presents with   Sinus Problem    Pressure, mucous, using saline and OTC meds, drinking lots of water as well   History of Present Illness:  Ms. Bosko has been evaluated today via acute telephone encounter for symptoms of sinus pressure/congestion.  She reports onset of symptoms beginning 12/10.  She initially had nasal secretions that were yellow/green in nature.  Since that time she has developed right maxillary sinus pain.  She continues to produce yellow/green nasal secretions and has postnasal drainage that is also yellow/green in color.  She has been managing her symptoms with Sudafed as well as nasal saline without significant symptomatic improvement.  She took a home COVID-19 test on Tuesday that was negative.  Today she developed right frontal sinus pain/pressure as well, which prompted her to contact our office for an appointment.  She denies fever/chills as well as cough and shortness of breath.  Ms. Terhaar is unaware of any recent sick contacts.  Assessment and Plan:  Acute bacterial sinusitis She has been evaluated today through telephone encounter for the symptoms endorsed above.  She has unilateral sinus pain with yellow/green secretions, concerning for bacterial sinusitis. -Amoxicillin  875 mg twice daily x 5 days has been prescribed -I recommended she continue her additional supportive care measures -She has been instructed to contact our office if she is has not improved or her symptoms have worsened following completion of antibiotic treatment.   Follow Up Instructions:  I discussed the assessment and treatment plan with the patient. The patient was provided an opportunity to ask questions and all were answered. The patient agreed with the plan and demonstrated an understanding of the instructions.   The patient was advised to call back or seek an in-person evaluation if the symptoms worsen or if the condition fails to improve as anticipated.  I provided 7 minutes of non-face-to-face time during this encounter.   Billie Lade, MD

## 2021-12-29 ENCOUNTER — Encounter: Payer: Self-pay | Admitting: Family Medicine

## 2021-12-29 MED ORDER — SEMAGLUTIDE-WEIGHT MANAGEMENT 0.5 MG/0.5ML ~~LOC~~ SOAJ: 0.5000 mg | SUBCUTANEOUS | 1 refills | Status: DC

## 2022-01-07 ENCOUNTER — Encounter: Payer: Self-pay | Admitting: Family Medicine

## 2022-01-25 ENCOUNTER — Encounter: Payer: Self-pay | Admitting: Family Medicine

## 2022-01-25 ENCOUNTER — Ambulatory Visit: Payer: No Typology Code available for payment source | Admitting: Family Medicine

## 2022-01-25 VITALS — BP 112/66 | HR 87 | Ht 63.0 in | Wt 216.0 lb

## 2022-01-25 DIAGNOSIS — E559 Vitamin D deficiency, unspecified: Secondary | ICD-10-CM

## 2022-01-25 DIAGNOSIS — E785 Hyperlipidemia, unspecified: Secondary | ICD-10-CM | POA: Diagnosis not present

## 2022-01-25 DIAGNOSIS — R7302 Impaired glucose tolerance (oral): Secondary | ICD-10-CM

## 2022-01-25 DIAGNOSIS — K219 Gastro-esophageal reflux disease without esophagitis: Secondary | ICD-10-CM

## 2022-01-25 MED ORDER — PHENTERMINE HCL 15 MG PO CAPS: 15.0000 mg | ORAL_CAPSULE | ORAL | 2 refills | Status: DC

## 2022-01-25 NOTE — Patient Instructions (Addendum)
F/u in 10 weeks, call if you need me sooner  New med for weight loss is phentermine 1 daily  Please get fasting labs ordered in Nov 3 to 5 days before next visit, Nurse print sheet and give pt  It is important that you exercise regularly at least 30 minutes 5 times a week. If you develop chest pain, have severe difficulty breathing, or feel very tired, stop exercising immediately and seek medical attention    Thanks for choosing [facility], we consider it a privelige to serve you.

## 2022-01-30 ENCOUNTER — Encounter: Payer: Self-pay | Admitting: Family Medicine

## 2022-01-30 NOTE — Assessment & Plan Note (Signed)
  Patient re-educated about  the importance of commitment to a  minimum of 150 minutes of exercise per week as able.  The importance of healthy food choices with portion control discussed, as well as eating regularly and within a 12 hour window most days. The need to choose "clean , green" food 50 to 75% of the time is discussed, as well as to make water the primary drink and set a goal of 64 ounces water daily.       01/25/2022    4:19 PM 11/30/2021    4:11 PM 05/25/2021    8:32 AM  Weight /BMI  Weight 216 lb 227 lb 229 lb 0.6 oz  Height 5\' 3"  (1.6 m) 5\' 3"  (1.6 m) 5\' 3"  (1.6 m)  BMI 38.26 kg/m2 40.21 kg/m2 40.57 kg/m2

## 2022-01-30 NOTE — Assessment & Plan Note (Signed)
Hyperlipidemia:Low fat diet discussed and encouraged.   Lipid Panel  Lab Results  Component Value Date   CHOL 177 05/25/2021   HDL 55 05/25/2021   LDLCALC 105 (H) 05/25/2021   TRIG 96 05/25/2021   CHOLHDL 3.2 05/25/2021     Updated lab needed at/ before next visit.

## 2022-01-30 NOTE — Assessment & Plan Note (Signed)
Controlled, no change in medication  

## 2022-01-30 NOTE — Progress Notes (Signed)
[name]     MRN: [id number]      DOB: 1975/10/23   HPI [name] is here for follow up and re-evaluation of chronic medical conditions, medication management and review of any available recent lab and radiology data.  Preventive health is updated, specifically  Cancer screening and Immunization.   Questions or concerns regarding consultations or procedures which the PT has had in the interim are  addressed. The PT denies any adverse reactions to current medications since the last visit.  Wants to take oral med for weight loss, has seen it work in her family and has had difficulty getting the wegovy Committing to regular exercise and has changed food choice ROS Denies recent fever or chills. Denies sinus pressure, nasal congestion, ear pain or sore throat. Denies chest congestion, productive cough or wheezing. Denies chest pains, palpitations and leg swelling Denies abdominal pain, nausea, vomiting,diarrhea or constipation.   Denies dysuria, frequency, hesitancy or incontinence. Denies joint pain, swelling and limitation in mobility. Denies headaches, seizures, numbness, or tingling. Denies depression, anxiety or insomnia. Denies skin break down or rash.   PE  BP 112/66 (BP Location: Right Arm, Patient Position: Sitting, Cuff Size: Large)   Pulse 87   Ht 5\' 3"  (1.6 m)   Wt 216 lb (98 kg)   SpO2 98%   BMI 38.26 kg/m   Patient alert and oriented and in no cardiopulmonary distress.  HEENT: No facial asymmetry, EOMI,     Neck supple .  Chest: Clear to auscultation bilaterally.  CVS: S1, S2 no murmurs, no S3.Regular rate.  ABD: Soft non tender.   Ext: No edema  MS: Adequate ROM spine, shoulders, hips and knees.  Skin: Intact, no ulcerations or rash noted.  Psych: Good eye contact, normal affect. Memory intact not anxious or depressed appearing.  CNS: CN 2-12 intact, power,  normal throughout.no focal deficits noted.   Assessment & Plan  GERD  (gastroesophageal reflux disease) Controlled, no change in medication   Morbid obesity  Patient re-educated about  the importance of commitment to a  minimum of 150 minutes of exercise per week as able.  The importance of healthy food choices with portion control discussed, as well as eating regularly and within a 12 hour window most days. The need to choose "clean , green" food 50 to 75% of the time is discussed, as well as to make water the primary drink and set a goal of 64 ounces water daily.       01/25/2022    4:19 PM 11/30/2021    4:11 PM 05/25/2021    8:32 AM  Weight /BMI  Weight 216 lb 227 lb 229 lb 0.6 oz  Height 5\' 3"  (1.6 m) 5\' 3"  (1.6 m) 5\' 3"  (1.6 m)  BMI 38.26 kg/m2 40.21 kg/m2 40.57 kg/m2      Dyslipidemia Hyperlipidemia:Low fat diet discussed and encouraged.   Lipid Panel  Lab Results  Component Value Date   CHOL 177 05/25/2021   HDL 55 05/25/2021   LDLCALC 105 (H) 05/25/2021   TRIG 96 05/25/2021   CHOLHDL 3.2 05/25/2021     Updated lab needed at/ before next visit.   Vitamin D deficiency Updated lab needed at/ before next visit.   IGT (impaired glucose tolerance) Patient educated about the importance of limiting  Carbohydrate intake , the need to commit to daily physical activity for a minimum of 30 minutes , and to commit weight loss. The fact that changes in all  these areas will reduce or eliminate all together the development of diabetes is stressed.      Latest Ref Rng & Units 05/25/2021    9:29 AM 08/06/2019   11:56 AM 08/02/2017   11:36 AM 11/13/2014    4:41 PM 06/05/2014   10:04 AM  Diabetic Labs  HbA1c 4.8 - 5.6 % 5.6  5.4  5.5  5.8  5.9   Chol 100 - 199 mg/dL 177  143  142   160   HDL >39 mg/dL 55  61  55   52   Calc LDL 0 - 99 mg/dL 105  65  73   92   Triglycerides 0 - 149 mg/dL 96  88  65   78   Creatinine 0.57 - 1.00 mg/dL 0.84  0.88  0.87  0.79  0.66       01/25/2022    4:19 PM 11/30/2021    4:43 PM 11/30/2021    4:17 PM  11/30/2021    4:11 PM 05/25/2021    8:32 AM 01/20/2021    9:44 AM 01/20/2021    9:40 AM  BP/Weight  Systolic BP 619 509 326 712 458 099 833  Diastolic BP 66 84 82 90 85 64 86  Wt. (Lbs) 216   227 229.04    BMI 38.26 kg/m2   40.21 kg/m2 40.57 kg/m2         No data to display          Updated lab needed at/ before next visit.

## 2022-01-30 NOTE — Assessment & Plan Note (Signed)
Updated lab needed at/ before next visit.   

## 2022-01-30 NOTE — Assessment & Plan Note (Signed)
Patient educated about the importance of limiting  Carbohydrate intake , the need to commit to daily physical activity for a minimum of 30 minutes , and to commit weight loss. The fact that changes in all these areas will reduce or eliminate all together the development of diabetes is stressed.      Latest Ref Rng & Units 05/25/2021    9:29 AM 08/06/2019   11:56 AM 08/02/2017   11:36 AM 11/13/2014    4:41 PM 06/05/2014   10:04 AM  Diabetic Labs  HbA1c 4.8 - 5.6 % 5.6  5.4  5.5  5.8  5.9   Chol 100 - 199 mg/dL 177  143  142   160   HDL >39 mg/dL 55  61  55   52   Calc LDL 0 - 99 mg/dL 105  65  73   92   Triglycerides 0 - 149 mg/dL 96  88  65   78   Creatinine 0.57 - 1.00 mg/dL 0.84  0.88  0.87  0.79  0.66       01/25/2022    4:19 PM 11/30/2021    4:43 PM 11/30/2021    4:17 PM 11/30/2021    4:11 PM 05/25/2021    8:32 AM 01/20/2021    9:44 AM 01/20/2021    9:40 AM  BP/Weight  Systolic BP 409 811 914 782 956 213 086  Diastolic BP 66 84 82 90 85 64 86  Wt. (Lbs) 216   227 229.04    BMI 38.26 kg/m2   40.21 kg/m2 40.57 kg/m2         No data to display          Updated lab needed at/ before next visit.

## 2022-02-09 ENCOUNTER — Encounter: Payer: Self-pay | Admitting: Internal Medicine

## 2022-02-09 ENCOUNTER — Telehealth (INDEPENDENT_AMBULATORY_CARE_PROVIDER_SITE_OTHER): Payer: No Typology Code available for payment source | Admitting: Internal Medicine

## 2022-02-09 DIAGNOSIS — B9689 Other specified bacterial agents as the cause of diseases classified elsewhere: Secondary | ICD-10-CM | POA: Diagnosis not present

## 2022-02-09 DIAGNOSIS — J019 Acute sinusitis, unspecified: Secondary | ICD-10-CM | POA: Diagnosis not present

## 2022-02-09 MED ORDER — AMOXICILLIN 875 MG PO TABS: 875.0000 mg | ORAL_TABLET | Freq: Two times a day (BID) | ORAL | 0 refills | Status: AC

## 2022-02-09 NOTE — Progress Notes (Signed)
Virtual Visit via Video Note  I connected with [name] on 02/15/22 at  1:00 PM EST by a video enabled telemedicine application and verified that I am speaking with the correct person using two identifiers.  Patient Location: Home Provider Location: Office/Clinic  I discussed the limitations, risks, security, and privacy concerns of performing an evaluation and management service by video and the availability of in person appointments. I also discussed with the patient that there may be a patient responsible charge related to this service. The patient expressed understanding and agreed to proceed.  Subjective: PCP: Fayrene Helper, MD  Chief Complaint  Patient presents with   Sore Throat    Patient states she has a sore throat and mucus is brown and yellowish. She is also been having a lot more bowel movements than usual.   [name] has been evaluated today through an acute video encounter for a 1 day history of sore throat and sinus pain/pressure.  She reports onset of symptoms last night.  She endorses a Tmax of 100.3 F.  She has been clearing yellow/brown mucus from her throat.  She endorses bilateral sinus pain/pressure that is worse on the right.  She also endorses significant throat irritation but has been able to eat and drink liquids without difficulty.  [name] has also noted that she has had more bowel movements recently.  She typically has 2-3 bowel movements per day, but most recently has had 6-7.  She attributes this to recently stopping phentermine.  She has not tried taking any specific medications for relief of sinus congestion or throat irritation.  ROS: Per HPI  Current Outpatient Medications:    amoxicillin (AMOXIL) 875 MG tablet, Take 1 tablet (875 mg total) by mouth 2 (two) times daily for 7 days., Disp: 14 tablet, Rfl: 0   Ascorbic Acid (VITAMIN C) 1000 MG tablet, Take 1,000 mg by mouth daily., Disp: , Rfl:    Cholecalciferol (VITAMIN D3)  125 MCG (5000 UT) TABS, Take 5,000 Units by mouth daily., Disp: , Rfl:    fluticasone (FLONASE) 50 MCG/ACT nasal spray, Place 1 spray into both nostrils daily as needed., Disp: , Rfl:    Multiple Vitamin (MULTIVITAMIN) tablet, Take 1 tablet by mouth daily., Disp: , Rfl:    pantoprazole (PROTONIX) 40 MG tablet, Take 1 tablet by mouth daily., Disp: , Rfl:    phentermine 15 MG capsule, Take 1 capsule (15 mg total) by mouth every morning. (Patient not taking: Reported on 02/09/2022), Disp: 30 capsule, Rfl: 2   polyethylene glycol (MIRALAX / GLYCOLAX) packet, Take 17 g by mouth daily., Disp: , Rfl:   Assessment and Plan: Acute bacterial sinusitis Assessment & Plan: Evaluated today through acute video encounter for the symptoms endorsed above.  Her symptoms seem most consistent with acute bacterial sinusitis. -I have prescribed amoxicillin 875 mg twice daily x 7 days -I have recommended routine use of nasal saline rinse followed by fluticasone nasal spray.  She can also try Sudafed as a nasal decongestant.  I have recommended use of Chloraseptic spray for relief of throat irritation. -She was instructed to return to care if her symptoms worsen or fail to improve despite antibiotic treatment  Follow Up Instructions: Return if symptoms worsen or fail to improve.   I discussed the assessment and treatment plan with the patient. The patient was provided an opportunity to ask questions, and all were answered. The patient agreed with the plan and demonstrated an understanding of the instructions.  The patient was advised to call back or seek an in-person evaluation if the symptoms worsen or if the condition fails to improve as anticipated.  The above assessment and management plan was discussed with the patient. The patient verbalized understanding of and has agreed to the management plan.   Susan Abraham, MD

## 2022-02-10 ENCOUNTER — Telehealth: Payer: No Typology Code available for payment source | Admitting: Internal Medicine

## 2022-02-15 DIAGNOSIS — J069 Acute upper respiratory infection, unspecified: Secondary | ICD-10-CM | POA: Insufficient documentation

## 2022-02-15 DIAGNOSIS — B9689 Other specified bacterial agents as the cause of diseases classified elsewhere: Secondary | ICD-10-CM | POA: Insufficient documentation

## 2022-02-15 NOTE — Assessment & Plan Note (Signed)
Evaluated today through acute video encounter for the symptoms endorsed above.  Her symptoms seem most consistent with acute bacterial sinusitis. -I have prescribed amoxicillin 875 mg twice daily x 7 days -I have recommended routine use of nasal saline rinse followed by fluticasone nasal spray.  She can also try Sudafed as a nasal decongestant.  I have recommended use of Chloraseptic spray for relief of throat irritation. -She was instructed to return to care if her symptoms worsen or fail to improve despite antibiotic treatment

## 2022-03-01 ENCOUNTER — Encounter: Payer: Self-pay | Admitting: Family Medicine

## 2022-03-03 ENCOUNTER — Encounter: Payer: Self-pay | Admitting: Family Medicine

## 2022-03-14 ENCOUNTER — Encounter: Payer: Self-pay | Admitting: Family Medicine

## 2022-03-14 MED ORDER — SEMAGLUTIDE-WEIGHT MANAGEMENT 0.25 MG/0.5ML ~~LOC~~ SOAJ: 0.2500 mg | SUBCUTANEOUS | 0 refills | Status: DC

## 2022-03-31 ENCOUNTER — Other Ambulatory Visit: Payer: Self-pay | Admitting: Family Medicine

## 2022-04-01 LAB — CBC WITH DIFFERENTIAL/PLATELET
Absolute Monocytes: 454 cells/uL (ref 200–950)
Basophils Absolute: 101 cells/uL (ref 0–200)
Basophils Relative: 1.2 %
Eosinophils Absolute: 277 cells/uL (ref 15–500)
Eosinophils Relative: 3.3 %
HCT: 39.1 % (ref 35.0–45.0)
Hemoglobin: 12.4 g/dL (ref 11.7–15.5)
Lymphs Abs: 3217 cells/uL (ref 850–3900)
MCH: 27.1 pg (ref 27.0–33.0)
MCHC: 31.7 g/dL — ABNORMAL LOW (ref 32.0–36.0)
MCV: 85.4 fL (ref 80.0–100.0)
MPV: 11.7 fL (ref 7.5–12.5)
Monocytes Relative: 5.4 %
Neutro Abs: 4351 cells/uL (ref 1500–7800)
Neutrophils Relative %: 51.8 %
Platelets: 412 10*3/uL — ABNORMAL HIGH (ref 140–400)
RBC: 4.58 10*6/uL (ref 3.80–5.10)
RDW: 13.1 % (ref 11.0–15.0)
Total Lymphocyte: 38.3 %
WBC: 8.4 10*3/uL (ref 3.8–10.8)

## 2022-04-01 LAB — VITAMIN D 25 HYDROXY (VIT D DEFICIENCY, FRACTURES): Vit D, 25-Hydroxy: 48 ng/mL (ref 30–100)

## 2022-04-01 LAB — LIPID PANEL
Cholesterol: 147 mg/dL (ref <200)
HDL: 55 mg/dL (ref >=50)
LDL Cholesterol (Calc): 78 mg/dL (calc)
Non-HDL Cholesterol (Calc): 92 mg/dL (calc) (ref <130)
Total CHOL/HDL Ratio: 2.7 (calc) (ref <5.0)
Triglycerides: 56 mg/dL (ref <150)

## 2022-04-01 LAB — COMPLETE METABOLIC PANEL WITH GFR
AG Ratio: 1.2 (calc) (ref 1.0–2.5)
ALT: 5 U/L — ABNORMAL LOW (ref 6–29)
AST: 12 U/L (ref 10–35)
Albumin: 3.7 g/dL (ref 3.6–5.1)
Alkaline phosphatase (APISO): 67 U/L (ref 31–125)
BUN: 11 mg/dL (ref 7–25)
CO2: 24 mmol/L (ref 20–32)
Calcium: 8.8 mg/dL (ref 8.6–10.2)
Chloride: 106 mmol/L (ref 98–110)
Creat: 0.76 mg/dL (ref 0.50–0.99)
Globulin: 3.1 g/dL (calc) (ref 1.9–3.7)
Glucose, Bld: 84 mg/dL (ref 65–99)
Potassium: 4.2 mmol/L (ref 3.5–5.3)
Sodium: 139 mmol/L (ref 135–146)
Total Bilirubin: 0.4 mg/dL (ref 0.2–1.2)
Total Protein: 6.8 g/dL (ref 6.1–8.1)
eGFR: 98 mL/min/{1.73_m2} (ref >=60)

## 2022-04-01 LAB — TSH: TSH: 2.48 mIU/L

## 2022-04-05 ENCOUNTER — Ambulatory Visit: Payer: No Typology Code available for payment source | Admitting: Family Medicine

## 2022-04-05 DIAGNOSIS — E785 Hyperlipidemia, unspecified: Secondary | ICD-10-CM | POA: Diagnosis not present

## 2022-04-05 DIAGNOSIS — R5383 Other fatigue: Secondary | ICD-10-CM | POA: Diagnosis not present

## 2022-04-05 DIAGNOSIS — D75839 Thrombocytosis, unspecified: Secondary | ICD-10-CM

## 2022-04-05 MED ORDER — WEGOVY 1 MG/0.5ML ~~LOC~~ SOAJ: 1.0000 mg | SUBCUTANEOUS | 0 refills | Status: DC

## 2022-04-05 MED ORDER — SEMAGLUTIDE-WEIGHT MANAGEMENT 0.5 MG/0.5ML ~~LOC~~ SOAJ: 0.5000 mg | SUBCUTANEOUS | 0 refills | Status: DC

## 2022-04-05 NOTE — Patient Instructions (Addendum)
F/U in 10 to 11 weeks, call if you need me sooner   Dose increases in wegovy as discussed  Nurse pls add iron and ferritin to recent lab  You are referred to hematology re high platelet count  Weight loss goal of 10 to 12 pounds  It is important that you exercise regularly at least 30 minutes 5 times a week. If you develop chest pain, have severe difficulty breathing, or feel very tired, stop exercising immediately and seek medical attention    Think about what you will eat, plan ahead. Choose " clean, green, fresh or frozen" over canned, processed or packaged foods which are more sugary, salty and fatty. 70 to 75% of food eaten should be vegetables and fruit. Three meals at set times with snacks allowed between meals, but they must be fruit or vegetables. Aim to eat over a 12 hour period , example 7 am to 7 pm, and STOP after  your last meal of the day. Drink water,generally about 64 ounces per day, no other drink is as healthy. Fruit juice is best enjoyed in a healthy way, by EATING the fruit.  Thanks for choosing [facility], we consider it a privelige to serve you.

## 2022-04-06 ENCOUNTER — Other Ambulatory Visit: Payer: Self-pay

## 2022-04-06 DIAGNOSIS — D75839 Thrombocytosis, unspecified: Secondary | ICD-10-CM

## 2022-04-10 ENCOUNTER — Encounter: Payer: Self-pay | Admitting: Family Medicine

## 2022-04-10 DIAGNOSIS — D75839 Thrombocytosis, unspecified: Secondary | ICD-10-CM | POA: Insufficient documentation

## 2022-04-10 NOTE — Progress Notes (Signed)
   [name]     MRN: [id number]      DOB: 09/08/1975   HPI [name] is here for follow up and re-evaluation of chronic medical conditions, medication management and review of any available recent lab and radiology data.  Preventive health is updated, specifically  Cancer screening and Immunization.   Questions or concerns regarding consultations or procedures which the PT has had in the interim are  addressed. The PT denies any adverse reactions to current medications since the last visit.  There are no new concerns.  There are no specific complaints   ROS Denies recent fever or chills. Denies sinus pressure, nasal congestion, ear pain or sore throat. Denies chest congestion, productive cough or wheezing. Denies chest pains, palpitations and leg swelling Denies abdominal pain, nausea, vomiting,diarrhea or constipation.   Denies dysuria, frequency, hesitancy or incontinence. Denies joint pain, swelling and limitation in mobility. Denies headaches, seizures, numbness, or tingling. Denies depression, anxiety or insomnia. Denies skin break down or rash.   PE  BP 124/83 (BP Location: Right Arm, Patient Position: Sitting, Cuff Size: Large)   Pulse 88   Ht 5\' 3"  (1.6 m)   Wt 212 lb (96.2 kg)   SpO2 94%   BMI 37.55 kg/m   Patient alert and oriented and in no cardiopulmonary distress.  HEENT: No facial asymmetry, EOMI,     Neck supple .  Chest: Clear to auscultation bilaterally.  CVS: S1, S2 no murmurs, no S3.Regular rate.  ABD: Soft non tender.   Ext: No edema  MS: Adequate ROM spine, shoulders, hips and knees.  Skin: Intact, no ulcerations or rash noted.  Psych: Good eye contact, normal affect. Memory intact not anxious or depressed appearing.  CNS: CN 2-12 intact, power,  normal throughout.no focal deficits noted.   Assessment & Plan  Morbid obesity Improved on medicatio with lifestylemodification,  continue and titrae up med dose  Patient  re-educated about  the importance of commitment to a  minimum of 150 minutes of exercise per week as able.  The importance of healthy food choices with portion control discussed, as well as eating regularly and within a 12 hour window most days. The need to choose "clean , green" food 50 to 75% of the time is discussed, as well as to make water the primary drink and set a goal of 64 ounces water daily.       04/05/2022    4:37 PM 01/25/2022    4:19 PM 11/30/2021    4:11 PM  Weight /BMI  Weight 212 lb 216 lb 227 lb  Height 5\' 3"  (1.6 m) 5\' 3"  (1.6 m) 5\' 3"  (1.6 m)  BMI 37.55 kg/m2 38.26 kg/m2 40.21 kg/m2      Dyslipidemia Hyperlipidemia:Low fat diet discussed and encouraged.   Lipid Panel  Lab Results  Component Value Date   CHOL 147 03/31/2022   HDL 55 03/31/2022   LDLCALC 78 03/31/2022   TRIG 56 03/31/2022   CHOLHDL 2.7 03/31/2022     Corrected with change in diet  Thrombocytosis Hematology to assess, longstanding over 5 years , no other ell line abn

## 2022-04-10 NOTE — Assessment & Plan Note (Signed)
Hyperlipidemia:Low fat diet discussed and encouraged.   Lipid Panel  Lab Results  Component Value Date   CHOL 147 03/31/2022   HDL 55 03/31/2022   LDLCALC 78 03/31/2022   TRIG 56 03/31/2022   CHOLHDL 2.7 03/31/2022     Corrected with change in diet

## 2022-04-10 NOTE — Assessment & Plan Note (Signed)
Hematology to assess, longstanding over 5 years , no other ell line abn

## 2022-04-10 NOTE — Assessment & Plan Note (Signed)
Improved on medicatio with lifestylemodification,  continue and titrae up med dose  Patient re-educated about  the importance of commitment to a  minimum of 150 minutes of exercise per week as able.  The importance of healthy food choices with portion control discussed, as well as eating regularly and within a 12 hour window most days. The need to choose "clean , green" food 50 to 75% of the time is discussed, as well as to make water the primary drink and set a goal of 64 ounces water daily.       04/05/2022    4:37 PM 01/25/2022    4:19 PM 11/30/2021    4:11 PM  Weight /BMI  Weight 212 lb 216 lb 227 lb  Height 5\' 3"  (1.6 m) 5\' 3"  (1.6 m) 5\' 3"  (1.6 m)  BMI 37.55 kg/m2 38.26 kg/m2 40.21 kg/m2

## 2022-04-11 NOTE — Progress Notes (Signed)
[facility] [address], [place] [address]   Clinic Day:  04/12/2022  Referring physician: Kerri Perches, MD  Patient Care Team: Kerri Perches, MD as PCP - General Rourk, Gerrit Friends, MD as Consulting Physician (Gastroenterology)   ASSESSMENT & PLAN:   Assessment:  1.  Thrombocytosis: - Seen at the request of Dr. Lodema Hong. - Platelet count elevated intermittently since 2008, ranging between 412-509. - No prior history of thrombosis.  No MI/CVA. - No B symptoms or recurrent infections.  No prior history of connective tissue disorders. - No recent oral iron use.  Last took iron after her pregnancy 9 years ago.  No prior history of blood transfusion or intravenous iron. - Last infection, strep throat in February 2024.  2.  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  No chemical exposure.  Non-smoker. - Father died of lung cancer.  Great uncle had cancer.  No current 2 tissue disorders in the family.  Plan:  1.  Thrombocytosis: - Discussed etiology including reactive versus clonal thrombocytosis. - Will check ferritin, iron panel, ESR, CRP, ANA/rheumatoid factor. - Will check JAK2 V617F with reflex testing. - If the ferritin is low and JAK2 V617F is negative, will give trial of oral iron therapy and recheck platelet count. - RTC 3 to 4 weeks for follow-up.   Orders Placed This Encounter  Procedures   Iron and TIBC ([facility] ONLY)    Standing Status:   Future    Number of Occurrences:   1    Standing Expiration Date:   04/12/2023   Ferritin    Standing Status:   Future    Number of Occurrences:   1    Standing Expiration Date:   04/12/2023   Vitamin B12    Standing Status:   Future    Number of Occurrences:   1    Standing Expiration Date:   04/12/2023   Folate    Standing Status:   Future    Number of Occurrences:   1    Standing Expiration Date:   04/12/2023   Lactate  dehydrogenase    Standing Status:   Future    Number of Occurrences:   1    Standing Expiration Date:   04/12/2023   ANA, IFA (with reflex)    Standing Status:   Future    Number of Occurrences:   1    Standing Expiration Date:   04/12/2023   Rheumatoid factor    Standing Status:   Future    Number of Occurrences:   1    Standing Expiration Date:   04/12/2023   C-reactive protein    Standing Status:   Future    Number of Occurrences:   1    Standing Expiration Date:   04/12/2023   Sedimentation rate    Standing Status:   Future    Number of Occurrences:   1    Standing Expiration Date:   04/12/2023   JAK2 V617F rfx CALR/MPL/E12-15    Standing Status:   Future    Number of Occurrences:   1    Standing Expiration Date:   04/12/2023      I,Katie Daubenspeck,acting as a scribe for Doreatha Massed, MD.,have documented all relevant documentation on the behalf of Doreatha Massed, MD,as directed by  Doreatha Massed, MD while in the presence of Doreatha Massed, MD.   I, Doreatha Massed MD, have reviewed  the above documentation for accuracy and completeness, and I agree with the above.   Doreatha MassedSreedhar Sumire Halbleib, MD   4/9/20245:04 PM  CHIEF COMPLAINT/PURPOSE OF CONSULT:   Diagnosis: Thrombocytosis  Current Therapy: Under workup  HISTORY OF PRESENT ILLNESS:   [name] is a 47 y.o. female presenting to clinic today for evaluation of intermittent elevated platelet count at the request of Dr. Lodema HongSimpson.  Today, [name] states that [name] is doing well overall. Her appetite level is at 100%. Her energy level is at 90%.  [name] has a history of intermittent elevated platelet count. The first recorded platelet count in Epic from 04/2006 was 509k. [facility] but was noted to be elevated in 06/2009 and 08/2012. Chart review shows platelets consistently >400k since 06/2014. There are no other consistent abnormalities in her CBC.   PAST MEDICAL HISTORY:   Past Medical  History: Past Medical History:  Diagnosis Date   Allergy    late Spring/early Summer   Chronic constipation    GERD (gastroesophageal reflux disease)    Hx of varicella    Hyperglycemia    IBS (irritable bowel syndrome)     Surgical History: Past Surgical History:  Procedure Laterality Date   BREAST REDUCTION SURGERY     BREAST SURGERY Bilateral 01/04/2000   reduction   bunnionectomy Left 01/04/2004   COLONOSCOPY     Per patient, colonoscopy in 2005 with [facility] revealing normal exam.   COLONOSCOPY N/A 01/20/2021   Procedure: COLONOSCOPY;  Surgeon: Corbin Adeourk, Robert M, MD;  [facility];  Service: Endoscopy;  Laterality: N/A;  9:30am   right ankle     TONSILLECTOMY  01/03/2002    Social History: Social History   Socioeconomic History   Marital status: Single    Spouse name: Not on file   Number of children: Not on file   Years of education: Not on file   Highest education level: Bachelor's degree (e.g., BA, AB, BS)  Occupational History   Not on file  Tobacco Use   Smoking status: Never   Smokeless tobacco: Never  Vaping Use   Vaping Use: Never used  Substance and Sexual Activity   Alcohol use: Yes    Comment: occ- 1-2 drinks a month.   Drug use: No   Sexual activity: Yes    Birth control/protection: None  Other Topics Concern   Not on file  Social History Narrative   Not on file   Social Determinants of Health   Financial Resource Strain: Low Risk  (04/05/2022)   Overall Financial Resource Strain (CARDIA)    Difficulty of Paying Living Expenses: Not hard at all  Food Insecurity: No Food Insecurity (04/05/2022)   Hunger Vital Sign    Worried About Running Out of Food in the Last Year: Never true    Ran Out of Food in the Last Year: Never true  Transportation Needs: No Transportation Needs (04/05/2022)   PRAPARE - Administrator, Civil ServiceTransportation    Lack of Transportation (Medical): No    Lack of Transportation (Non-Medical): No  Physical Activity: Insufficiently  Active (04/05/2022)   Exercise Vital Sign    Days of Exercise per Week: 3 days    Minutes of Exercise per Session: 30 min  Stress: No Stress Concern Present (04/05/2022)   [employer] - Occupational Stress Questionnaire    Feeling of Stress : Not at all  Social Connections: Moderately Integrated (04/05/2022)   Social Connection and Isolation Panel [NHANES]    Frequency  of Communication with Friends and Family: More than three times a week    Frequency of Social Gatherings with Friends and Family: Twice a week    Attends Religious Services: More than 4 times per year    Active Member of [employer] or Organizations: Yes    Attends Engineer, structural: More than 4 times per year    Marital Status: Never married  Catering manager Violence: Not on file    Family History: Family History  Problem Relation Age of Onset   Hypothyroidism Mother    Hyperlipidemia Mother    Cancer Father        lung   Diabetes Father    Hypertension Sister    Diabetes Maternal Aunt    Hypertension Paternal Aunt    Diabetes Paternal Aunt    Colon cancer Neg Hx    Colon polyps Neg Hx     Current Medications:  Current Outpatient Medications:    fluticasone (FLONASE) 50 MCG/ACT nasal spray, Place 1 spray into both nostrils daily as needed., Disp: , Rfl:    polyethylene glycol (MIRALAX / GLYCOLAX) packet, Take 17 g by mouth daily., Disp: , Rfl:    [START ON 05/17/2022] Semaglutide-Weight Management (WEGOVY) 1 MG/0.5ML SOAJ, Inject 1 mg into the skin once a week., Disp: 2 mL, Rfl: 0   [START ON 04/19/2022] Semaglutide-Weight Management 0.5 MG/0.5ML SOAJ, Inject 0.5 mg into the skin once a week., Disp: 2 mL, Rfl: 0   Allergies: Allergies  Allergen Reactions   Shellfish Allergy Hives and Shortness Of Breath    REVIEW OF SYSTEMS:   Review of Systems  Constitutional:  Negative for chills, fatigue and fever.  HENT:   Negative for lump/mass, mouth sores, nosebleeds, sore throat  and trouble swallowing.   Eyes:  Negative for eye problems.  Respiratory:  Negative for cough and shortness of breath.   Cardiovascular:  Negative for chest pain, leg swelling and palpitations.  Gastrointestinal:  Negative for abdominal pain, constipation, diarrhea, nausea and vomiting.  Genitourinary:  Negative for bladder incontinence, difficulty urinating, dysuria, frequency, hematuria and nocturia.   Musculoskeletal:  Negative for arthralgias, back pain, flank pain, myalgias and neck pain.  Skin:  Negative for itching and rash.  Neurological:  Negative for dizziness, headaches and numbness.  Hematological:  Does not bruise/bleed easily.  Psychiatric/Behavioral:  Negative for depression, sleep disturbance and suicidal ideas. The patient is not nervous/anxious.   All other systems reviewed and are negative.    VITALS:   Blood pressure 119/78, pulse 79, temperature 98.8 F (37.1 C), temperature source Oral, resp. rate 17, height 5\' 3"  (1.6 m), weight 209 lb 3.2 oz (94.9 kg), SpO2 100 %.  Wt Readings from Last 3 Encounters:  04/12/22 209 lb 3.2 oz (94.9 kg)  04/05/22 212 lb (96.2 kg)  01/25/22 216 lb (98 kg)    Body mass index is 37.06 kg/m.   PHYSICAL EXAM:   Physical Exam Vitals and nursing note reviewed. Exam conducted with a chaperone present.  Constitutional:      Appearance: Normal appearance.  Cardiovascular:     Rate and Rhythm: Normal rate and regular rhythm.     Pulses: Normal pulses.     Heart sounds: Normal heart sounds.  Pulmonary:     Effort: Pulmonary effort is normal.     Breath sounds: Normal breath sounds.  Abdominal:     Palpations: Abdomen is soft. There is no hepatomegaly, splenomegaly or mass.     Tenderness: There is no  abdominal tenderness.  Musculoskeletal:     Right lower leg: No edema.     Left lower leg: No edema.  Lymphadenopathy:     Cervical: No cervical adenopathy.     Right cervical: No superficial, deep or posterior cervical  adenopathy.    Left cervical: No superficial, deep or posterior cervical adenopathy.     Upper Body:     Right upper body: No supraclavicular or axillary adenopathy.     Left upper body: No supraclavicular or axillary adenopathy.  Neurological:     General: No focal deficit present.     Mental Status: [name] is alert and oriented to person, place, and time.  Psychiatric:        Mood and Affect: Mood normal.        Behavior: Behavior normal.     LABS:      Latest Ref Rng & Units 03/31/2022    7:16 AM 05/25/2021    9:29 AM 08/06/2019   11:56 AM  CBC  WBC 3.8 - 10.8 Thousand/uL 8.4  8.5  8.5   Hemoglobin 11.7 - 15.5 g/dL 14.2  39.5  32.0   Hematocrit 35.0 - 45.0 % 39.1  39.0  42.8   Platelets 140 - 400 Thousand/uL 412  417  467       Latest Ref Rng & Units 03/31/2022    7:16 AM 05/25/2021    9:29 AM 08/06/2019   11:56 AM  CMP  Glucose 65 - 99 mg/dL 84  97  233   BUN 7 - 25 mg/dL 11  9  7    Creatinine 0.50 - 0.99 mg/dL 4.35  6.86  1.68   Sodium 135 - 146 mmol/L 139  138  139   Potassium 3.5 - 5.3 mmol/L 4.2  4.9  4.6   Chloride 98 - 110 mmol/L 106  101  102   CO2 20 - 32 mmol/L 24  27  28    Calcium 8.6 - 10.2 mg/dL 8.8  9.2  9.6   Total Protein 6.1 - 8.1 g/dL 6.8  6.6  7.5   Total Bilirubin 0.2 - 1.2 mg/dL 0.4  0.4  0.6   Alkaline Phos 44 - 121 IU/L  66    AST 10 - 35 U/L 12  18  17    ALT 6 - 29 U/L 5  9  10       No results found for: "CEA1", "CEA" / No results found for: "CEA1", "CEA" No results found for: "PSA1" No results found for: "HFG902" No results found for: "CAN125"  No results found for: "TOTALPROTELP", "ALBUMINELP", "A1GS", "A2GS", "BETS", "BETA2SER", "GAMS", "MSPIKE", "SPEI" Lab Results  Component Value Date   TIBC 348 04/12/2022   TIBC 329 06/05/2014   TIBC 413 06/14/2006   FERRITIN 41 04/12/2022   FERRITIN 41 06/05/2014   FERRITIN 33 06/14/2006   IRONPCTSAT 16 04/12/2022   IRONPCTSAT 21 06/05/2014   IRONPCTSAT 47 06/14/2006   Lab Results  Component  Value Date   LDH 142 04/12/2022     STUDIES:   No results found.

## 2022-04-12 ENCOUNTER — Inpatient Hospital Stay: Payer: PRIVATE HEALTH INSURANCE | Attending: Hematology | Admitting: Hematology

## 2022-04-12 ENCOUNTER — Inpatient Hospital Stay: Payer: PRIVATE HEALTH INSURANCE

## 2022-04-12 VITALS — BP 119/78 | HR 79 | Temp 98.8°F | Resp 17 | Ht 63.0 in | Wt 209.2 lb

## 2022-04-12 DIAGNOSIS — Z801 Family history of malignant neoplasm of trachea, bronchus and lung: Secondary | ICD-10-CM | POA: Insufficient documentation

## 2022-04-12 DIAGNOSIS — D75839 Thrombocytosis, unspecified: Secondary | ICD-10-CM

## 2022-04-12 LAB — FOLATE: Folate: 11.7 ng/mL (ref >5.9)

## 2022-04-12 LAB — FERRITIN: Ferritin: 41 ng/mL (ref 11–307)

## 2022-04-12 LAB — LACTATE DEHYDROGENASE: LDH: 142 U/L (ref 98–192)

## 2022-04-12 LAB — SEDIMENTATION RATE: Sed Rate: 7 mm/hr (ref 0–22)

## 2022-04-12 LAB — IRON AND TIBC
Iron: 56 ug/dL (ref 28–170)
Saturation Ratios: 16 % (ref 10.4–31.8)
TIBC: 348 ug/dL (ref 250–450)
UIBC: 292 ug/dL

## 2022-04-12 LAB — VITAMIN B12: Vitamin B-12: 221 pg/mL (ref 180–914)

## 2022-04-12 LAB — C-REACTIVE PROTEIN: CRP: 0.6 mg/dL (ref <1.0)

## 2022-04-12 NOTE — Patient Instructions (Signed)
You were seen and examined today by Dr. Ellin Saba. Dr. Ellin Saba is a hematologist, meaning that he specializes in blood abnormalities. Dr. Ellin Saba discussed your past medical history, family history of cancers/blood conditions and the events that led to you being here today.  You were referred to Dr. Ellin Saba due to thrombocytosis.  Dr. Ellin Saba has recommended additional labs today for further evaluation.  Follow-up as scheduled.

## 2022-04-13 LAB — ANTINUCLEAR ANTIBODIES, IFA: ANA Ab, IFA: NEGATIVE

## 2022-04-14 LAB — RHEUMATOID FACTOR: Rheumatoid fact SerPl-aCnc: <10 IU/mL (ref <14.0)

## 2022-04-21 LAB — CALR +MPL + E12-E15  (REFLEX)

## 2022-04-21 LAB — JAK2 V617F RFX CALR/MPL/E12-15

## 2022-04-28 ENCOUNTER — Inpatient Hospital Stay (HOSPITAL_BASED_OUTPATIENT_CLINIC_OR_DEPARTMENT_OTHER): Payer: PRIVATE HEALTH INSURANCE | Admitting: Physician Assistant

## 2022-04-28 VITALS — BP 136/80 | HR 88 | Temp 98.6°F | Resp 18 | Wt 202.0 lb

## 2022-04-28 DIAGNOSIS — D75839 Thrombocytosis, unspecified: Secondary | ICD-10-CM | POA: Diagnosis not present

## 2022-04-28 MED ORDER — FERROUS SULFATE 325 (65 FE) MG PO TBEC: 325.0000 mg | DELAYED_RELEASE_TABLET | Freq: Every day | ORAL | 3 refills | Status: AC

## 2022-04-28 NOTE — Progress Notes (Signed)
[facility] [address] [place] [address]   Clinic Day:  04/28/2022  Referring physician: Kerri Perches, MD  Patient Care Team: Kerri Perches, MD as PCP - General Rourk, Gerrit Friends, MD as Consulting Physician (Gastroenterology)   CHIEF COMPLAINT/PURPOSE OF CONSULT:   Diagnosis: Thrombocytosis  HISTORY OF PRESENT ILLNESS:   [name] is a 47 y.o. female returns for a follow up for evaluation of thrombocytosis. She is accompanied by her mother for this visit. She was last seen by Dr. Ellin Saba on 04/12/2022 to establish care.   [name] reports she is nervous about today's visit and she is eager to review the results. She is otherwise feeling well. Her energy and appetite are stable. She denies any GI symptoms such as nausea, vomiting or diarrhea. She does have occasional constipation that is well managed with laxatives as needed. She denies any easy bruising or signs of bleeding except for her monthly menstrual cycle. She reports her menstrual cycles last approximately 5 days with 2 days of heavy bleeding. She has taken iron pills in the past for iron deficiency. She denies fevers, chills, sweats, shortness of breath, chest pain or cough. She has no other complaints. Rest of the ROS is below.    PAST MEDICAL HISTORY:   Past Medical History: Past Medical History:  Diagnosis Date   Allergy    late Spring/early Summer   Chronic constipation    GERD (gastroesophageal reflux disease)    Hx of varicella    Hyperglycemia    IBS (irritable bowel syndrome)     Surgical History: Past Surgical History:  Procedure Laterality Date   BREAST REDUCTION SURGERY     BREAST SURGERY Bilateral 01/04/2000   reduction   bunnionectomy Left 01/04/2004   COLONOSCOPY     Per patient, colonoscopy in 2005 with [facility] revealing normal exam.   COLONOSCOPY N/A 01/20/2021   Procedure: COLONOSCOPY;  Surgeon: Corbin Ade, MD;  [facility];   Service: Endoscopy;  Laterality: N/A;  9:30am   right ankle     TONSILLECTOMY  01/03/2002    Social History: Social History   Socioeconomic History   Marital status: Single    Spouse name: Not on file   Number of children: Not on file   Years of education: Not on file   Highest education level: Bachelor's degree (e.g., BA, AB, BS)  Occupational History   Not on file  Tobacco Use   Smoking status: Never   Smokeless tobacco: Never  Vaping Use   Vaping Use: Never used  Substance and Sexual Activity   Alcohol use: Yes    Comment: occ- 1-2 drinks a month.   Drug use: No   Sexual activity: Yes    Birth control/protection: None  Other Topics Concern   Not on file  Social History Narrative   Not on file   Social Determinants of Health   Financial Resource Strain: Low Risk  (04/05/2022)   Overall Financial Resource Strain (CARDIA)    Difficulty of Paying Living Expenses: Not hard at all  Food Insecurity: No Food Insecurity (04/05/2022)   Hunger Vital Sign    Worried About Running Out of Food in the Last Year: Never true    Ran Out of Food in the Last Year: Never true  Transportation Needs: No Transportation Needs (04/05/2022)   PRAPARE - Administrator, Civil Service (Medical): No    Lack of Transportation (Non-Medical): No  Physical  Activity: Insufficiently Active (04/05/2022)   Exercise Vital Sign    Days of Exercise per Week: 3 days    Minutes of Exercise per Session: 30 min  Stress: No Stress Concern Present (04/05/2022)   [employer] of Occupational Health - Occupational Stress Questionnaire    Feeling of Stress : Not at all  Social Connections: Moderately Integrated (04/05/2022)   Social Connection and Isolation Panel [NHANES]    Frequency of Communication with Friends and Family: More than three times a week    Frequency of Social Gatherings with Friends and Family: Twice a week    Attends Religious Services: More than 4 times per year    Active Member of  [employer] or Organizations: Yes    Attends Engineer, structural: More than 4 times per year    Marital Status: Never married  Catering manager Violence: Not on file    Family History: Family History  Problem Relation Age of Onset   Hypothyroidism Mother    Hyperlipidemia Mother    Cancer Father        lung   Diabetes Father    Hypertension Sister    Diabetes Maternal Aunt    Hypertension Paternal Aunt    Diabetes Paternal Aunt    Colon cancer Neg Hx    Colon polyps Neg Hx     Current Medications:  Current Outpatient Medications:    fluticasone (FLONASE) 50 MCG/ACT nasal spray, Place 1 spray into both nostrils daily as needed., Disp: , Rfl:    polyethylene glycol (MIRALAX / GLYCOLAX) packet, Take 17 g by mouth daily., Disp: , Rfl:    [START ON 05/17/2022] Semaglutide-Weight Management (WEGOVY) 1 MG/0.5ML SOAJ, Inject 1 mg into the skin once a week., Disp: 2 mL, Rfl: 0   Semaglutide-Weight Management 0.5 MG/0.5ML SOAJ, Inject 0.5 mg into the skin once a week., Disp: 2 mL, Rfl: 0   Allergies: Allergies  Allergen Reactions   Shellfish Allergy Hives and Shortness Of Breath    REVIEW OF SYSTEMS:   Review of Systems  Constitutional:  Negative for fatigue and fever.  Respiratory:  Negative for cough and shortness of breath.   Cardiovascular:  Negative for chest pain.  Gastrointestinal:  Positive for constipation. Negative for abdominal pain, diarrhea, nausea and vomiting.  Psychiatric/Behavioral:  The patient is nervous/anxious.   All other systems reviewed and are negative.    VITALS:   Blood pressure 136/80, pulse 88, temperature 98.6 F (37 C), temperature source Oral, resp. rate 18, weight 202 lb (91.6 kg), SpO2 100 %.  Wt Readings from Last 3 Encounters:  04/28/22 202 lb (91.6 kg)  04/12/22 209 lb 3.2 oz (94.9 kg)  04/05/22 212 lb (96.2 kg)    Body mass index is 35.78 kg/m.   PHYSICAL EXAM:   Physical Exam Vitals reviewed.  Constitutional:       Appearance: Normal appearance.  Cardiovascular:     Rate and Rhythm: Normal rate and regular rhythm.     Heart sounds: Normal heart sounds.  Pulmonary:     Effort: Pulmonary effort is normal.     Breath sounds: Normal breath sounds.  Abdominal:     Palpations: Abdomen is soft.  Musculoskeletal:     Right lower leg: No edema.     Left lower leg: No edema.  Neurological:     General: No focal deficit present.     Mental Status: She is alert and oriented to person, place, and time.  Psychiatric:  Mood and Affect: Mood normal.        Behavior: Behavior normal.     LABS:      Latest Ref Rng & Units 03/31/2022    7:16 AM 05/25/2021    9:29 AM 08/06/2019   11:56 AM  CBC  WBC 3.8 - 10.8 Thousand/uL 8.4  8.5  8.5   Hemoglobin 11.7 - 15.5 g/dL 16.1  09.6  04.5   Hematocrit 35.0 - 45.0 % 39.1  39.0  42.8   Platelets 140 - 400 Thousand/uL 412  417  467       Latest Ref Rng & Units 03/31/2022    7:16 AM 05/25/2021    9:29 AM 08/06/2019   11:56 AM  CMP  Glucose 65 - 99 mg/dL 84  97  409   BUN 7 - 25 mg/dL 11  9  7    Creatinine 0.50 - 0.99 mg/dL 8.11  9.14  7.82   Sodium 135 - 146 mmol/L 139  138  139   Potassium 3.5 - 5.3 mmol/L 4.2  4.9  4.6   Chloride 98 - 110 mmol/L 106  101  102   CO2 20 - 32 mmol/L 24  27  28    Calcium 8.6 - 10.2 mg/dL 8.8  9.2  9.6   Total Protein 6.1 - 8.1 g/dL 6.8  6.6  7.5   Total Bilirubin 0.2 - 1.2 mg/dL 0.4  0.4  0.6   Alkaline Phos 44 - 121 IU/L  66    AST 10 - 35 U/L 12  18  17    ALT 6 - 29 U/L 5  9  10       No results found for: "CEA1", "CEA" / No results found for: "CEA1", "CEA" No results found for: "PSA1" No results found for: "NFA213" No results found for: "CAN125"  No results found for: "TOTALPROTELP", "ALBUMINELP", "A1GS", "A2GS", "BETS", "BETA2SER", "GAMS", "MSPIKE", "SPEI" Lab Results  Component Value Date   TIBC 348 04/12/2022   TIBC 329 06/05/2014   TIBC 413 06/14/2006   FERRITIN 41 04/12/2022   FERRITIN 41 06/05/2014    FERRITIN 33 06/14/2006   IRONPCTSAT 16 04/12/2022   IRONPCTSAT 21 06/05/2014   IRONPCTSAT 47 06/14/2006   Lab Results  Component Value Date   LDH 142 04/12/2022    ASSESSMENT & PLAN:   Assessment:  1.  Thrombocytosis: - Seen at the request of Dr. Lodema Hong. - Platelet count elevated intermittently since 2008, ranging between 412-509. - No prior history of thrombosis.  No MI/CVA. - No B symptoms or recurrent infections.  No prior history of connective tissue disorders. - No recent oral iron use.  Last took iron after her pregnancy 9 years ago.  No prior history of blood transfusion or intravenous iron. - Last infection, strep throat in February 2024.  2.  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  No chemical exposure.  Non-smoker. - Father died of lung cancer.  Great uncle had cancer.  No current 2 tissue disorders in the family.  Plan:  1.  Thrombocytosis: - Workup from 04/12/2022 ruled out B12 and folate deficiency and inflammatory process. There is no evidence of driver mutations with negative JAK2, MPL and CALR panel --Iron panel shows levels are low end of normal. Due to heavy menstrual bleeding, recommend a trial of ferrous sulfate 325 mg once daily with a source of vitamin C. If she has constipation, okay to take once every other day.   --  No indication for bone marrow biopsy.  - RTC 3 months with repeat labs.   Patient expressed understanding of the plan provided.   I have spent a total of 25 minutes minutes of face-to-face and non-face-to-face time, preparing to see the patient, performing a medically appropriate examination, counseling and educating the patient, ordering medications,  documenting clinical information in the electronic health record and care coordination.   Georga Kaufmann PA-C Dept of Hematology and Oncology [facility]

## 2022-06-14 ENCOUNTER — Other Ambulatory Visit: Payer: Self-pay | Admitting: Family Medicine

## 2022-06-16 ENCOUNTER — Encounter: Payer: Self-pay | Admitting: Family Medicine

## 2022-06-16 ENCOUNTER — Ambulatory Visit: Payer: No Typology Code available for payment source | Admitting: Family Medicine

## 2022-06-16 VITALS — BP 118/78 | HR 92 | Ht 63.0 in | Wt 198.1 lb

## 2022-06-16 DIAGNOSIS — L309 Dermatitis, unspecified: Secondary | ICD-10-CM

## 2022-06-16 DIAGNOSIS — N907 Vulvar cyst: Secondary | ICD-10-CM | POA: Diagnosis not present

## 2022-06-16 MED ORDER — KETOCONAZOLE 2 % EX SHAM: 1.0000 | MEDICATED_SHAMPOO | CUTANEOUS | 1 refills | Status: DC

## 2022-06-16 MED ORDER — CLOTRIMAZOLE-BETAMETHASONE 1-0.05 % EX CREA: 1.0000 | TOPICAL_CREAM | Freq: Two times a day (BID) | CUTANEOUS | 1 refills | Status: DC

## 2022-06-16 MED ORDER — SEMAGLUTIDE-WEIGHT MANAGEMENT 1.7 MG/0.75ML ~~LOC~~ SOAJ: 1.7000 mg | SUBCUTANEOUS | 2 refills | Status: DC

## 2022-06-16 NOTE — Patient Instructions (Signed)
F/U in 14 weeks, call if you need me sooner  Excellent weight loss, keep up great habits   Weight loss goal of 14 pounds in next 14 weeks   Meds sent for rash and avoid sulight during mid day, use sun screen as well  Thanks for choosing [facility], we consider it a privelige to serve you.

## 2022-06-22 ENCOUNTER — Encounter: Payer: Self-pay | Admitting: Family Medicine

## 2022-06-22 DIAGNOSIS — N907 Vulvar cyst: Secondary | ICD-10-CM | POA: Insufficient documentation

## 2022-06-22 NOTE — Assessment & Plan Note (Signed)
Current flare withfungal infection, clotrimazole/ betameth prescribed

## 2022-06-22 NOTE — Assessment & Plan Note (Signed)
Reports cyst which requests removal for, refer to Gyne of her choice

## 2022-06-22 NOTE — Progress Notes (Signed)
   [name]     MRN: [id number]      DOB: 03-01-1975  Chief Complaint  Patient presents with   Follow-up    Weight check, cyst in private area she would like removed, eczema     HPI [name] is here for follow up and re-evaluation of chronic medical conditions, medication management and review of any available recent lab and radiology data.  Preventive health is updated, specifically  Cancer screening and Immunization.   Questions or concerns regarding consultations or procedures which the PT has had in the interim are  addressed. The PT denies any adverse reactions to current medications since the last visit.  Concerns as above   ROS Denies recent fever or chills. Denies sinus pressure, nasal congestion, ear pain or sore throat. Denies chest congestion, productive cough or wheezing. Denies chest pains, palpitations and leg swelling Denies abdominal pain, nausea, vomiting,diarrhea or constipation.   Denies dysuria, frequency, hesitancy or incontinence. Denies joint pain, swelling and limitation in mobility. Denies headaches, seizures, numbness, or tingling. Denies depression, anxiety or insomnia.   PE  BP 118/78 (BP Location: Right Arm, Patient Position: Sitting, Cuff Size: Large)   Pulse 92   Ht 5\' 3"  (1.6 m)   Wt 198 lb 1.9 oz (89.9 kg)   SpO2 99%   BMI 35.10 kg/m   Patient alert and oriented and in no cardiopulmonary distress.  HEENT: No facial asymmetry, EOMI,     Neck supple .  Chest: Clear to auscultation bilaterally.  CVS: S1, S2 no murmurs, no S3.Regular rate.  ABD: Soft non tender.   Ext: No edema  MS: Adequate ROM spine, shoulders, hips and knees.  Skin: Intact, macular rash noted no neck, no erythema or drainage.  Psych: Good eye contact, normal affect. Memory intact not anxious or depressed appearing.  CNS: CN 2-12 intact, power,  normal throughout.no focal deficits noted.   Assessment & Plan  Morbid obesity Improved  Patient  re-educated about  the importance of commitment to a  minimum of 150 minutes of exercise per week as able.  The importance of healthy food choices with portion control discussed, as well as eating regularly and within a 12 hour window most days. The need to choose "clean , green" food 50 to 75% of the time is discussed, as well as to make water the primary drink and set a goal of 64 ounces water daily.       06/16/2022    4:32 PM 04/28/2022   10:08 AM 04/12/2022    8:42 AM  Weight /BMI  Weight 198 lb 1.9 oz 202 lb 209 lb 3.2 oz  Height 5\' 3"  (1.6 m)  5\' 3"  (1.6 m)  BMI 35.1 kg/m2 35.78 kg/m2 37.06 kg/m2    Continue current regime  Eczema Current flare withfungal infection, clotrimazole/ betameth prescribed  Labial cyst Reports cyst which requests removal for, refer to Gyne of her choice

## 2022-06-22 NOTE — Assessment & Plan Note (Signed)
Improved  Patient re-educated about  the importance of commitment to a  minimum of 150 minutes of exercise per week as able.  The importance of healthy food choices with portion control discussed, as well as eating regularly and within a 12 hour window most days. The need to choose "clean , green" food 50 to 75% of the time is discussed, as well as to make water the primary drink and set a goal of 64 ounces water daily.       06/16/2022    4:32 PM 04/28/2022   10:08 AM 04/12/2022    8:42 AM  Weight /BMI  Weight 198 lb 1.9 oz 202 lb 209 lb 3.2 oz  Height 5\' 3"  (1.6 m)  5\' 3"  (1.6 m)  BMI 35.1 kg/m2 35.78 kg/m2 37.06 kg/m2    Continue current regime

## 2022-06-23 NOTE — Addendum Note (Signed)
Addended by: Syliva Overman E on: 06/23/2022 11:35 AM   Modules accepted: Orders

## 2022-08-04 ENCOUNTER — Inpatient Hospital Stay: Payer: No Typology Code available for payment source | Attending: Hematology

## 2022-08-04 DIAGNOSIS — D75839 Thrombocytosis, unspecified: Secondary | ICD-10-CM | POA: Diagnosis present

## 2022-08-04 DIAGNOSIS — E611 Iron deficiency: Secondary | ICD-10-CM | POA: Diagnosis not present

## 2022-08-04 LAB — COMPREHENSIVE METABOLIC PANEL
ALT: 10 U/L (ref 0–44)
AST: 16 U/L (ref 15–41)
Albumin: 3.7 g/dL (ref 3.5–5.0)
Alkaline Phosphatase: 55 U/L (ref 38–126)
Anion gap: 9 (ref 5–15)
BUN: 9 mg/dL (ref 6–20)
CO2: 24 mmol/L (ref 22–32)
Calcium: 8.3 mg/dL — ABNORMAL LOW (ref 8.9–10.3)
Chloride: 102 mmol/L (ref 98–111)
Creatinine, Ser: 0.79 mg/dL (ref 0.44–1.00)
GFR, Estimated: >60 mL/min (ref >60)
Glucose, Bld: 89 mg/dL (ref 70–99)
Potassium: 3.5 mmol/L (ref 3.5–5.1)
Sodium: 135 mmol/L (ref 135–145)
Total Bilirubin: 0.4 mg/dL (ref 0.3–1.2)
Total Protein: 7 g/dL (ref 6.5–8.1)

## 2022-08-04 LAB — IRON AND TIBC
Iron: 67 ug/dL (ref 28–170)
Saturation Ratios: 24 % (ref 10.4–31.8)
TIBC: 278 ug/dL (ref 250–450)
UIBC: 211 ug/dL

## 2022-08-04 LAB — CBC WITH DIFFERENTIAL/PLATELET
Abs Immature Granulocytes: 0.02 10*3/uL (ref 0.00–0.07)
Basophils Absolute: 0.1 10*3/uL (ref 0.0–0.1)
Basophils Relative: 1 %
Eosinophils Absolute: 0.3 10*3/uL (ref 0.0–0.5)
Eosinophils Relative: 3 %
HCT: 37.2 % (ref 36.0–46.0)
Hemoglobin: 11.9 g/dL — ABNORMAL LOW (ref 12.0–15.0)
Immature Granulocytes: 0 %
Lymphocytes Relative: 30 %
Lymphs Abs: 2.5 10*3/uL (ref 0.7–4.0)
MCH: 27.5 pg (ref 26.0–34.0)
MCHC: 32 g/dL (ref 30.0–36.0)
MCV: 85.9 fL (ref 80.0–100.0)
Monocytes Absolute: 0.4 10*3/uL (ref 0.1–1.0)
Monocytes Relative: 5 %
Neutro Abs: 5.1 10*3/uL (ref 1.7–7.7)
Neutrophils Relative %: 61 %
Platelets: 406 10*3/uL — ABNORMAL HIGH (ref 150–400)
RBC: 4.33 MIL/uL (ref 3.87–5.11)
RDW: 13.5 % (ref 11.5–15.5)
WBC: 8.5 10*3/uL (ref 4.0–10.5)
nRBC: 0 % (ref 0.0–0.2)

## 2022-08-04 LAB — FERRITIN: Ferritin: 62 ng/mL (ref 11–307)

## 2022-08-11 ENCOUNTER — Ambulatory Visit: Payer: No Typology Code available for payment source | Admitting: Physician Assistant

## 2022-08-12 ENCOUNTER — Emergency Department (HOSPITAL_COMMUNITY)
Admission: EM | Admit: 2022-08-12 | Discharge: 2022-08-13 | Disposition: A | Discharge status: Home / Self Care | Payer: PRIVATE HEALTH INSURANCE | Attending: Emergency Medicine | Admitting: Emergency Medicine

## 2022-08-12 ENCOUNTER — Emergency Department (HOSPITAL_COMMUNITY): Payer: PRIVATE HEALTH INSURANCE

## 2022-08-12 ENCOUNTER — Encounter (HOSPITAL_COMMUNITY): Payer: Self-pay

## 2022-08-12 ENCOUNTER — Other Ambulatory Visit: Payer: Self-pay

## 2022-08-12 DIAGNOSIS — R9431 Abnormal electrocardiogram [ECG] [EKG]: Secondary | ICD-10-CM | POA: Insufficient documentation

## 2022-08-12 DIAGNOSIS — R0789 Other chest pain: Secondary | ICD-10-CM | POA: Diagnosis not present

## 2022-08-12 DIAGNOSIS — R079 Chest pain, unspecified: Secondary | ICD-10-CM | POA: Diagnosis present

## 2022-08-12 LAB — CBC
HCT: 38.8 % (ref 36.0–46.0)
Hemoglobin: 12.4 g/dL (ref 12.0–15.0)
MCH: 27.2 pg (ref 26.0–34.0)
MCHC: 32 g/dL (ref 30.0–36.0)
MCV: 85.1 fL (ref 80.0–100.0)
Platelets: 442 10*3/uL — ABNORMAL HIGH (ref 150–400)
RBC: 4.56 MIL/uL (ref 3.87–5.11)
RDW: 14.2 % (ref 11.5–15.5)
WBC: 10.7 10*3/uL — ABNORMAL HIGH (ref 4.0–10.5)
nRBC: 0 % (ref 0.0–0.2)

## 2022-08-12 LAB — BASIC METABOLIC PANEL
Anion gap: 9 (ref 5–15)
BUN: 12 mg/dL (ref 6–20)
CO2: 24 mmol/L (ref 22–32)
Calcium: 9.1 mg/dL (ref 8.9–10.3)
Chloride: 104 mmol/L (ref 98–111)
Creatinine, Ser: 0.79 mg/dL (ref 0.44–1.00)
GFR, Estimated: >60 mL/min (ref >60)
Glucose, Bld: 103 mg/dL — ABNORMAL HIGH (ref 70–99)
Potassium: 3.1 mmol/L — ABNORMAL LOW (ref 3.5–5.1)
Sodium: 137 mmol/L (ref 135–145)

## 2022-08-12 LAB — TROPONIN I (HIGH SENSITIVITY): Troponin I (High Sensitivity): 2 ng/L (ref <18)

## 2022-08-12 NOTE — Discharge Instructions (Signed)
You were seen for your chest pain in the emergency department.   At home, please take tylenol and ibuprofen for your pain.   Follow-up with your primary doctor in 2-3 days regarding your visit.  Cardiology will be calling you regarding an appointment within the next 72 hours.  You may contact them if you do not hear from them in that time using the information in this packet.  Return immediately to the emergency department if you experience any of the following: Worsening pain, difficulty breathing, unexplained vomiting or sweating, or any other concerning symptoms.    Thank you for visiting our Emergency Department. It was a pleasure taking care of you today.

## 2022-08-12 NOTE — ED Triage Notes (Signed)
Pt states she is having a pain that comes & goes on the left side of chest that radiates through her breast to her nipple. States it just comes & goes.  Denies SOB, N/V/D

## 2022-08-12 NOTE — ED Provider Notes (Signed)
[place] EMERGENCY DEPARTMENT AT [facility] Provider Note   CSN: [id number] Arrival date & time: 08/12/22  2102     History {Add pertinent medical, surgical, social history, OB history to HPI:1} Chief Complaint  Patient presents with   Chest Pain    [name] is a 47 y.o. female.  47 year old female with a history of allergies and IBS who presents emergency department chest pain.  Patient reports that she has had sharp chest pains that are fleeting and radiate from her upper chest to her left nipple on the left side.  First time she felt it was on Sunday.  Says that over the past 24 hours has become more frequent.  No exacerbating or alleviating factors including breathing or exertion.  No diaphoresis or vomiting or radiation otherwise.  No personal history of MI, PE, OCP use, cancer, or recent surgery.  Does not smoke.  Says she was googling symptoms of heart attack and was concerned so she came into the emergency department.  No history of breast cancer is not noticed any breast masses.  No nipple changes or discharge.       Home Medications Prior to Admission medications   Medication Sig Start Date End Date Taking? Authorizing Provider  clotrimazole-betamethasone (LOTRISONE) cream Apply 1 Application topically 2 (two) times daily. 06/16/22   Kerri Perches, MD  ferrous sulfate 325 (65 FE) MG EC tablet Take 1 tablet (325 mg total) by mouth daily with breakfast. 04/28/22   Georga Kaufmann T, PA-C  fluticasone (FLONASE) 50 MCG/ACT nasal spray Place 1 spray into both nostrils daily as needed.    [provider]  ketoconazole (NIZORAL) 2 % shampoo Apply 1 Application topically 2 (two) times a week. 06/16/22   Kerri Perches, MD  polyethylene glycol [facility] / Ethelene Hal) packet Take 17 g by mouth daily.    [provider]  Semaglutide-Weight Management 1.7 MG/0.75ML SOAJ Inject 1.7 mg into the skin once a week. 07/13/22   Kerri Perches, MD       Allergies    Shellfish allergy    Review of Systems   Review of Systems  Physical Exam Updated Vital Signs BP (!) 145/82 (BP Location: Right Arm)   Pulse 93   Temp 99 F (37.2 C) (Oral)   Resp 18   Ht 5\' 3"  (1.6 m)   Wt 88.9 kg   LMP 07/30/2022   SpO2 100%   BMI 34.72 kg/m  Physical Exam Vitals and nursing note reviewed.  Constitutional:      General: She is not in acute distress.    Appearance: She is well-developed.  HENT:     Head: Normocephalic and atraumatic.     Right Ear: External ear normal.     Left Ear: External ear normal.     Nose: Nose normal.  Eyes:     Extraocular Movements: Extraocular movements intact.     Conjunctiva/sclera: Conjunctivae normal.     Pupils: Pupils are equal, round, and reactive to light.  Cardiovascular:     Rate and Rhythm: Normal rate and regular rhythm.     Heart sounds: No murmur heard.    Comments: Chest pain not reproducible.  No breast masses palpated. Pulmonary:     Effort: Pulmonary effort is normal. No respiratory distress.     Breath sounds: Normal breath sounds.  Abdominal:     General: Abdomen is flat. There is no distension.     Palpations: Abdomen is soft. There  is no mass.     Tenderness: There is no abdominal tenderness. There is no guarding.  Musculoskeletal:     Cervical back: Normal range of motion and neck supple.     Right lower leg: No edema.     Left lower leg: No edema.  Skin:    General: Skin is warm and dry.  Neurological:     Mental Status: She is alert and oriented to person, place, and time. Mental status is at baseline.  Psychiatric:        Mood and Affect: Mood normal.     ED Results / Procedures / Treatments   Labs (all labs ordered are listed, but only abnormal results are displayed) Labs Reviewed  CBC - Abnormal; Notable for the following components:      Result Value   WBC 10.7 (*)    Platelets 442 (*)    All other components within normal limits  BASIC METABOLIC PANEL   POC URINE PREG, ED  TROPONIN I (HIGH SENSITIVITY)    EKG EKG Interpretation Date/Time:  Friday August 12 2022 21:26:43 EDT Ventricular Rate:  88 PR Interval:  132 QRS Duration:  78 QT Interval:  362 QTC Calculation: 438 R Axis:   43  Text Interpretation: Normal sinus rhythm Nonspecific ST and T wave abnormality Abnormal ECG When compared with ECG of 13-Feb-2020 15:19, Nonspecific T wave abnormality, worse in Anterolateral leads Confirmed by Vonita Moss [phone]) on 08/12/2022 10:24:20 PM  Radiology DG Chest 2 View  Result Date: 08/12/2022 CLINICAL DATA:  Chest pain EXAM: CHEST - 2 VIEW COMPARISON:  06/24/2009 FINDINGS: Lungs are clear.  No pleural effusion or pneumothorax. The heart is normal in size. Visualized osseous structures are within normal limits. IMPRESSION: Normal chest radiographs. Electronically Signed   By: Charline Bills M.D.   On: 08/12/2022 22:27    Procedures Procedures  {Document cardiac monitor, telemetry assessment procedure when appropriate:1}  Medications Ordered in ED Medications - No data to display  ED Course/ Medical Decision Making/ A&P   {   Click here for ABCD2, HEART and other calculatorsREFRESH Note before signing :1}                              Medical Decision Making Amount and/or Complexity of Data Reviewed Labs: ordered. Radiology: ordered.   ***  {Document critical care time when appropriate:1} {Document review of labs and clinical decision tools ie heart score, Chads2Vasc2 etc:1}  {Document your independent review of radiology images, and any outside records:1} {Document your discussion with family members, caretakers, and with consultants:1} {Document social determinants of health affecting pt's care:1} {Document your decision making why or why not admission, treatments were needed:1} Final Clinical Impression(s) / ED Diagnoses Final diagnoses:  None    Rx / DC Orders ED Discharge Orders     None

## 2022-08-14 NOTE — Progress Notes (Unsigned)
[facility] [address], [place] [address]   CLINIC:  Medical Oncology/Hematology  PCP:  Kerri Perches, MD [address] [place] [address] [phone]   REASON FOR VISIT:  Follow-up for thrombocytosis and iron deficiency  CURRENT THERAPY: Ferrous sulfate  INTERVAL HISTORY:   [name] 47 y.o. female returns for routine follow-up of thrombocytosis and iron deficiency.  She was last seen by Georga Kaufmann PA-C on 04/28/2022.  At today's visit, she reports feeling ***.  She has been taking ferrous sulfate daily *** since her last visit 3 months ago.  She is tolerating this well.  *** *** Heavy menses *** Rectal bleeding/melena *** She denies any vasomotor symptoms, aquagenic pruritus, or erythromelalgia. *** No B symptoms or recurrent infections.  She has ***% energy and ***% appetite. She endorses that she is maintaining a stable weight.  ASSESSMENT & PLAN:  1.   Thrombocytosis & iron deficiency: - Platelet count elevated intermittently since 2008, ranging between 412-509. - No prior history of thrombosis.  No MI/CVA. - No prior history of connective tissue disorders.  She is a non-smoker. - No prior history of blood transfusion or intravenous iron. - Workup from 04/12/2022 ruled out B12 and folate deficiency and inflammatory process (normal CRP, ESR, LDH, ANA, RF). There is no evidence of driver mutations with negative JAK2, MPL and CALR panel.  Iron panel showed borderline iron deficiency (ferritin 41, iron saturation 16%. - No B symptoms or recurrent infections.*** She reports heavy menstrual bleeding, but denies any rectal bleeding or melena.*** - She has been taking ferrous sulfate daily since April 2024 *** - Most recent labs (08/04/2022): Ferritin improved at 61, iron saturation 24%.  Hgb 11.9/MCV 85.9.  Platelets improved at 406. - PLAN: Continue oral iron supplementation every *** day - Due to marginal B12 (221) when checked  in April 2024, recommend starting vitamin B12 500 mcg every other day ***.  Will check B12/MMA at follow-up. - Labs and RTC with *** visit in 6 months.  2.  Social/family history: - She works as a Freight forwarder at [employer].  No chemical exposure.  Non-smoker. - Father died of lung cancer.  Great uncle had cancer.  No current 2 tissue disorders in the family.  PLAN SUMMARY: >> *** >> *** >> ***    REVIEW OF SYSTEMS: ***  Review of Systems - Oncology   PHYSICAL EXAM:  ECOG PERFORMANCE STATUS: {CHL ONC ECOG QP:[id number]} *** There were no vitals filed for this visit. There were no vitals filed for this visit. Physical Exam  PAST MEDICAL/SURGICAL HISTORY:  Past Medical History:  Diagnosis Date   Allergy    late Spring/early Summer   Chronic constipation    GERD (gastroesophageal reflux disease)    Hx of varicella    Hyperglycemia    IBS (irritable bowel syndrome)    Past Surgical History:  Procedure Laterality Date   BREAST REDUCTION SURGERY     BREAST SURGERY Bilateral 01/04/2000   reduction   bunnionectomy Left 01/04/2004   COLONOSCOPY     Per patient, colonoscopy in 2005 with [facility] revealing normal exam.   COLONOSCOPY N/A 01/20/2021   Procedure: COLONOSCOPY;  Surgeon: Corbin Ade, MD;  Location: [facility];  Service: Endoscopy;  Laterality: N/A;  9:30am   right ankle     TONSILLECTOMY  01/03/2002    SOCIAL HISTORY:  Social History   Socioeconomic History   Marital status:  Single    Spouse name: Not on file   Number of children: Not on file   Years of education: Not on file   Highest education level: Bachelor's degree (e.g., BA, AB, BS)  Occupational History   Not on file  Tobacco Use   Smoking status: Never   Smokeless tobacco: Never  Vaping Use   Vaping status: Never Used  Substance and Sexual Activity   Alcohol use: Yes    Comment: occ- 1-2 drinks a month.   Drug use: No   Sexual activity: Yes    Birth  control/protection: None  Other Topics Concern   Not on file  Social History Narrative   Not on file   Social Determinants of Health   Financial Resource Strain: Low Risk  (04/05/2022)   Overall Financial Resource Strain (CARDIA)    Difficulty of Paying Living Expenses: Not hard at all  Food Insecurity: No Food Insecurity (04/05/2022)   Hunger Vital Sign    Worried About Running Out of Food in the Last Year: Never true    Ran Out of Food in the Last Year: Never true  Transportation Needs: No Transportation Needs (04/05/2022)   PRAPARE - Administrator, Civil Service (Medical): No    Lack of Transportation (Non-Medical): No  Physical Activity: Insufficiently Active (04/05/2022)   Exercise Vital Sign    Days of Exercise per Week: 3 days    Minutes of Exercise per Session: 30 min  Stress: No Stress Concern Present (04/05/2022)   [employer] of Occupational Health - Occupational Stress Questionnaire    Feeling of Stress : Not at all  Social Connections: Moderately Integrated (04/05/2022)   Social Connection and Isolation Panel [NHANES]    Frequency of Communication with Friends and Family: More than three times a week    Frequency of Social Gatherings with Friends and Family: Twice a week    Attends Religious Services: More than 4 times per year    Active Member of [employer] or Organizations: Yes    Attends Engineer, structural: More than 4 times per year    Marital Status: Never married  Catering manager Violence: Not on file    FAMILY HISTORY:  Family History  Problem Relation Age of Onset   Hypothyroidism Mother    Hyperlipidemia Mother    Cancer Father        lung   Diabetes Father    Hypertension Sister    Diabetes Maternal Aunt    Hypertension Paternal Aunt    Diabetes Paternal Aunt    Colon cancer Neg Hx    Colon polyps Neg Hx     CURRENT MEDICATIONS:  Outpatient Encounter Medications as of 08/15/2022  Medication Sig   clotrimazole-betamethasone  (LOTRISONE) cream Apply 1 Application topically 2 (two) times daily.   ferrous sulfate 325 (65 FE) MG EC tablet Take 1 tablet (325 mg total) by mouth daily with breakfast.   fluticasone (FLONASE) 50 MCG/ACT nasal spray Place 1 spray into both nostrils daily as needed.   ketoconazole (NIZORAL) 2 % shampoo Apply 1 Application topically 2 (two) times a week.   polyethylene glycol (MIRALAX / GLYCOLAX) packet Take 17 g by mouth daily.   Semaglutide-Weight Management 1.7 MG/0.75ML SOAJ Inject 1.7 mg into the skin once a week.   No facility-administered encounter medications on file as of 08/15/2022.    ALLERGIES:  Allergies  Allergen Reactions   Shellfish Allergy Hives and Shortness Of Breath    LABORATORY DATA:  I have reviewed the labs as listed.  CBC    Component Value Date/Time   WBC 10.7 (H) 08/12/2022 2131   RBC 4.56 08/12/2022 2131   HGB 12.4 08/12/2022 2131   HGB 12.5 05/25/2021 0929   HCT 38.8 08/12/2022 2131   HCT 39.0 05/25/2021 0929   PLT 442 (H) 08/12/2022 2131   PLT 417 05/25/2021 0929   MCV 85.1 08/12/2022 2131   MCV 85 05/25/2021 0929   MCH 27.2 08/12/2022 2131   MCHC 32.0 08/12/2022 2131   RDW 14.2 08/12/2022 2131   RDW 13.1 05/25/2021 0929   LYMPHSABS 2.5 08/04/2022 1111   MONOABS 0.4 08/04/2022 1111   EOSABS 0.3 08/04/2022 1111   BASOSABS 0.1 08/04/2022 1111      Latest Ref Rng & Units 08/12/2022    9:31 PM 08/04/2022   11:11 AM 03/31/2022    7:16 AM  CMP  Glucose 70 - 99 mg/dL 119  89  84   BUN 6 - 20 mg/dL 12  9  11    Creatinine 0.44 - 1.00 mg/dL 1.47  8.29  5.62   Sodium 135 - 145 mmol/L 137  135  139   Potassium 3.5 - 5.1 mmol/L 3.1  3.5  4.2   Chloride 98 - 111 mmol/L 104  102  106   CO2 22 - 32 mmol/L 24  24  24    Calcium 8.9 - 10.3 mg/dL 9.1  8.3  8.8   Total Protein 6.5 - 8.1 g/dL  7.0  6.8   Total Bilirubin 0.3 - 1.2 mg/dL  0.4  0.4   Alkaline Phos 38 - 126 U/L  55    AST 15 - 41 U/L  16  12   ALT 0 - 44 U/L  10  5     DIAGNOSTIC IMAGING:   I have independently reviewed the relevant imaging and discussed with the patient.   WRAP UP:  All questions were answered. The patient knows to call the clinic with any problems, questions or concerns.  Medical decision making: ***  Time spent on visit: I spent *** minutes counseling the patient face to face. The total time spent in the appointment was *** minutes and more than 50% was on counseling.  Carnella Guadalajara, PA-C  ***

## 2022-08-15 ENCOUNTER — Inpatient Hospital Stay (HOSPITAL_BASED_OUTPATIENT_CLINIC_OR_DEPARTMENT_OTHER): Payer: No Typology Code available for payment source | Admitting: Physician Assistant

## 2022-08-15 VITALS — BP 120/79 | HR 78 | Temp 98.4°F | Resp 17

## 2022-08-15 DIAGNOSIS — E611 Iron deficiency: Secondary | ICD-10-CM | POA: Diagnosis not present

## 2022-08-15 DIAGNOSIS — E538 Deficiency of other specified B group vitamins: Secondary | ICD-10-CM | POA: Diagnosis not present

## 2022-08-15 DIAGNOSIS — D75839 Thrombocytosis, unspecified: Secondary | ICD-10-CM

## 2022-08-15 NOTE — Patient Instructions (Addendum)
[facility] at [facility] **VISIT SUMMARY & IMPORTANT INSTRUCTIONS **   You were seen today by Rojelio Brenner PA-C for your iron deficiency and elevated platelets.    ELEVATED PLATELETS ("THROMBOCYTOSIS") Your elevated platelets are most likely your body's natural reaction to your iron levels being low. Your platelet levels have improved slightly since you started taking iron pills.  IRON DEFICIENCY Your iron levels have improved. Continue to take iron tablets every day.  B12 DEFICIENCY Your labs showed borderline vitamin B12 levels. I would like you to start taking vitamin B12 500 mcg every day.  LABS: Return in 6 months to recheck your, and B12 levels.  FOLLOW-UP APPOINTMENT: Telephone visit in 6 months, about 1 week after labs  ** Thank you for trusting me with your healthcare!  I strive to provide all of my patients with quality care at each visit.  If you receive a survey for this visit, I would be so grateful to you for taking the time to provide feedback.  Thank you in advance!  ~ Alexi Dorminey                   Dr. Doreatha Massed   &   Rojelio Brenner, PA-C   - - - - - - - - - - - - - - - - - -    Thank you for choosing [facility] at [facility] to provide your oncology and hematology care.  To afford each patient quality time with our provider, please arrive at least 15 minutes before your scheduled appointment time.   If you have a lab appointment with the Cancer Center please come in thru the Main Entrance and check in at the main information desk.  You need to re-schedule your appointment should you arrive 10 or more minutes late.  We strive to give you quality time with our providers, and arriving late affects you and other patients whose appointments are after yours.  Also, if you no show three or more times for appointments you may be dismissed from the clinic at the providers discretion.     Again, thank you for  choosing [facility].  Our hope is that these requests will decrease the amount of time that you wait before being seen by our physicians.       _____________________________________________________________  Should you have questions after your visit to [facility], please contact our office at [phone] and follow the prompts.  Our office hours are 8:00 a.m. and 4:30 p.m. Monday - Friday.  Please note that voicemails left after 4:00 p.m. may not be returned until the following business day.  We are closed weekends and major holidays.  You do have access to a nurse 24-7, just call the main number to the clinic [phone] and do not press any options, hold on the line and a nurse will answer the phone.    For prescription refill requests, have your pharmacy contact our office and allow 72 hours.

## 2022-09-22 ENCOUNTER — Ambulatory Visit (INDEPENDENT_AMBULATORY_CARE_PROVIDER_SITE_OTHER): Payer: PRIVATE HEALTH INSURANCE | Admitting: Family Medicine

## 2022-09-22 ENCOUNTER — Encounter: Payer: Self-pay | Admitting: Family Medicine

## 2022-09-22 VITALS — BP 131/77 | HR 74 | Ht 63.0 in | Wt 188.1 lb

## 2022-09-22 DIAGNOSIS — E669 Obesity, unspecified: Secondary | ICD-10-CM | POA: Diagnosis not present

## 2022-09-22 DIAGNOSIS — Z1231 Encounter for screening mammogram for malignant neoplasm of breast: Secondary | ICD-10-CM | POA: Diagnosis not present

## 2022-09-22 DIAGNOSIS — E785 Hyperlipidemia, unspecified: Secondary | ICD-10-CM

## 2022-09-22 DIAGNOSIS — D75839 Thrombocytosis, unspecified: Secondary | ICD-10-CM | POA: Diagnosis not present

## 2022-09-22 DIAGNOSIS — E559 Vitamin D deficiency, unspecified: Secondary | ICD-10-CM | POA: Diagnosis not present

## 2022-09-22 DIAGNOSIS — L309 Dermatitis, unspecified: Secondary | ICD-10-CM

## 2022-09-22 MED ORDER — SEMAGLUTIDE-WEIGHT MANAGEMENT 2.4 MG/0.75ML ~~LOC~~ SOAJ: 2.4000 mg | SUBCUTANEOUS | 0 refills | Status: DC

## 2022-09-22 NOTE — Patient Instructions (Signed)
F/u end December or early Januaary , call if you ned me sooner   Dose increase in wegovy to 2.4 mg weekly when next filling  Plan for 8 to 10 pound weight ;loss  Please schedule mammogram at checkout  Congrats on weight loas keep it up  It is important that you exercise regularly at least 30 minutes 5 times a week. If you develop chest pain, have severe difficulty breathing, or feel very tired, stop exercising immediately and seek medical attention   Think about what you will eat, plan ahead. Choose " clean, green, fresh or frozen" over canned, processed or packaged foods which are more sugary, salty and fatty. 70 to 75% of food eaten should be vegetables and fruit. Three meals at set times with snacks allowed between meals, but they must be fruit or vegetables. Aim to eat over a 12 hour period , example 7 am to 7 pm, and STOP after  your last meal of the day. Drink water,generally about 64 ounces per day, no other drink is as healthy. Fruit juice is best enjoyed in a healthy way, by EATING the fruit.

## 2022-09-26 ENCOUNTER — Encounter: Payer: Self-pay | Admitting: Family Medicine

## 2022-09-26 NOTE — Assessment & Plan Note (Signed)
Great improvement on wegovy will increase to maximal dose  Target 8 to 10 pound weight loss  Patient re-educated about  the importance of commitment to a  minimum of 150 minutes of exercise per week as able.  The importance of healthy food choices with portion control discussed, as well as eating regularly and within a 12 hour window most days. The need to choose "clean , green" food 50 to 75% of the time is discussed, as well as to make water the primary drink and set a goal of 64 ounces water daily.       09/22/2022    4:30 PM 08/12/2022    9:26 PM 06/16/2022    4:32 PM  Weight /BMI  Weight 188 lb 1.9 oz 196 lb 198 lb 1.9 oz  Height 5\' 3"  (1.6 m) 5\' 3"  (1.6 m) 5\' 3"  (1.6 m)  BMI 33.32 kg/m2 34.72 kg/m2 35.1 kg/m2

## 2022-09-26 NOTE — Assessment & Plan Note (Signed)
Controlled, no change in medication  

## 2022-09-26 NOTE — Assessment & Plan Note (Signed)
Followed by hematology

## 2022-09-26 NOTE — Assessment & Plan Note (Signed)
Hyperlipidemia:Low fat diet discussed and encouraged.   Lipid Panel  Lab Results  Component Value Date   CHOL 147 03/31/2022   HDL 55 03/31/2022   LDLCALC 78 03/31/2022   TRIG 56 03/31/2022   CHOLHDL 2.7 03/31/2022

## 2022-09-26 NOTE — Progress Notes (Signed)
[name]     MRN: [id number]      DOB: [birth year]  Chief Complaint  Patient presents with   Follow-up    Follow up    HPI [name] is here for follow up and re-evaluation of chronic medical conditions, medication management and review of any available recent lab and radiology data.  Preventive health is updated, specifically  Cancer screening and Immunization.   Has appointment for Cardiology The PT denies any adverse reactions to current medications since the last visit.  There are no new concerns.  There are no specific complaints   ROS Denies recent fever or chills. Denies sinus pressure, nasal congestion, ear pain or sore throat. Denies chest congestion, productive cough or wheezing. Denies chest pains, palpitations and leg swelling Denies abdominal pain, nausea, vomiting,diarrhea or constipation.   Denies dysuria, frequency, hesitancy or incontinence. Denies joint pain, swelling and limitation in mobility. Denies headaches, seizures, numbness, or tingling. Denies depression, anxiety or insomnia. Denies skin break down or rash.   PE  BP 131/77 (BP Location: Right Arm, Patient Position: Sitting, Cuff Size: Large)   Pulse 74   Ht 5\' 3"  (1.6 m)   Wt 188 lb 1.9 oz (85.3 kg)   SpO2 98%   BMI 33.32 kg/m   Patient alert and oriented and in no cardiopulmonary distress.  HEENT: No facial asymmetry, EOMI,     Neck supple .  Chest: Clear to auscultation bilaterally.  CVS: S1, S2 no murmurs, no S3.Regular rate.  ABD: Soft non tender.   Ext: No edema  MS: Adequate ROM spine, shoulders, hips and knees.  Skin: Intact, no ulcerations or rash noted.  Psych: Good eye contact, normal affect. Memory intact not anxious or depressed appearing.  CNS: CN 2-12 intact, power,  normal throughout.no focal deficits noted.   Assessment & Plan  Obesity (BMI 30.0-34.9) Great improvement on wegovy will increase to maximal dose  Target 8 to 10 pound weight  loss  Patient re-educated about  the importance of commitment to a  minimum of 150 minutes of exercise per week as able.  The importance of healthy food choices with portion control discussed, as well as eating regularly and within a 12 hour window most days. The need to choose "clean , green" food 50 to 75% of the time is discussed, as well as to make water the primary drink and set a goal of 64 ounces water daily.       09/22/2022    4:30 PM 08/12/2022    9:26 PM 06/16/2022    4:32 PM  Weight /BMI  Weight 188 lb 1.9 oz 196 lb 198 lb 1.9 oz  Height 5\' 3"  (1.6 m) 5\' 3"  (1.6 m) 5\' 3"  (1.6 m)  BMI 33.32 kg/m2 34.72 kg/m2 35.1 kg/m2      Thrombocytosis Followed by hematology  Vitamin D deficiency Controlled, no change in medication   Eczema Controlled, no change in medication   Dyslipidemia Hyperlipidemia:Low fat diet discussed and encouraged.   Lipid Panel  Lab Results  Component Value Date   CHOL 147 03/31/2022   HDL 55 03/31/2022   LDLCALC 78 03/31/2022   TRIG 56 03/31/2022   CHOLHDL 2.7 03/31/2022

## 2022-10-19 ENCOUNTER — Encounter: Payer: Self-pay | Admitting: Family Medicine

## 2022-10-21 ENCOUNTER — Ambulatory Visit: Payer: PRIVATE HEALTH INSURANCE | Attending: Internal Medicine | Admitting: Internal Medicine

## 2022-10-21 ENCOUNTER — Encounter: Payer: Self-pay | Admitting: Internal Medicine

## 2022-10-21 VITALS — BP 136/76 | HR 78 | Ht 63.0 in | Wt 186.0 lb

## 2022-10-21 DIAGNOSIS — R0789 Other chest pain: Secondary | ICD-10-CM | POA: Diagnosis not present

## 2022-10-21 NOTE — Patient Instructions (Signed)
Medication Instructions:  Your physician recommends that you continue on your current medications as directed. Please refer to the Current Medication list given to you today.  *If you need a refill on your cardiac medications before your next appointment, please call your pharmacy*   Lab Work: None If you have labs (blood work) drawn today and your tests are completely normal, you will receive your results only by: MyChart Message (if you have MyChart) OR A paper copy in the mail If you have any lab test that is abnormal or we need to change your treatment, we will call you to review the results.   Testing/Procedures: None   Follow-Up: At [facility], you and your health needs are our priority.  As part of our continuing mission to provide you with exceptional heart care, we have created designated Provider Care Teams.  These Care Teams include your primary Cardiologist (physician) and Advanced Practice Providers (APPs -  Physician Assistants and Nurse Practitioners) who all work together to provide you with the care you need, when you need it.  We recommend signing up for the patient portal called "MyChart".  Sign up information is provided on this After Visit Summary.  MyChart is used to connect with patients for Virtual Visits (Telemedicine).  Patients are able to view lab/test results, encounter notes, upcoming appointments, etc.  Non-urgent messages can be sent to your provider as well.   To learn more about what you can do with MyChart, go to [email or web address].    Your next appointment:   Follow up as needed.    Provider:   Vishnu Mallipeddi, MD    Other Instructions    

## 2022-10-21 NOTE — Progress Notes (Signed)
Cardiology Office Note  Date: 10/21/2022   ID: Gracelyn, Bellows 1975-03-15, MRN [id number]  PCP:  Kerri Perches, MD  Cardiologist:  Marjo Bicker, MD Electrophysiologist:  None   History of Present Illness: ARONDA WIEDERHOLT is a 47 y.o. female with no significant PMH was referred to cardiology clinic for evaluation of chest pains.  Patient was on a vacation in 08/2022 when she started to feel pains in the left side of her chest radiating to her nipple, felt more like a twinge, last for a few seconds and resolved spontaneously.  This happened a few times in 08/2022 prompting ER visit, troponins and EKG within normal limits.  EKG in the ER showed NSR, nonspecific ST depressions with baseline EKG was wavy and so these ST depressions were not real.  Repeat EKG was performed in the clinic today that showed NSR and no evidence of ST depressions.  Patient had 1 recurrent chest pain episode 09/2022 (the same twinge she had in August 2024) and did not have any recurrence since then.  No chest pains with exertion at all.  Denies having any symptoms of DOE, orthopnea, PND, dizziness, presyncope, syncope and leg swelling.  Past Medical History:  Diagnosis Date   Allergy    late Spring/early Summer   Chronic constipation    GERD (gastroesophageal reflux disease)    Hx of varicella    Hyperglycemia    IBS (irritable bowel syndrome)     Past Surgical History:  Procedure Laterality Date   BREAST REDUCTION SURGERY     BREAST SURGERY Bilateral 01/04/2000   reduction   bunnionectomy Left 01/04/2004   COLONOSCOPY     Per patient, colonoscopy in 2005 with [facility] revealing normal exam.   COLONOSCOPY N/A 01/20/2021   Procedure: COLONOSCOPY;  Surgeon: Corbin Ade, MD;  [facility];  Service: Endoscopy;  Laterality: N/A;  9:30am   right ankle     TONSILLECTOMY  01/03/2002    Current Outpatient Medications  Medication Sig Dispense Refill   Cholecalciferol  (VITAMIN D3) 1000 units CAPS Take by mouth.     clotrimazole-betamethasone (LOTRISONE) cream Apply 1 Application topically 2 (two) times daily. 45 g 1   ferrous sulfate 325 (65 FE) MG EC tablet Take 1 tablet (325 mg total) by mouth daily with breakfast. 30 tablet 3   fluticasone (FLONASE) 50 MCG/ACT nasal spray Place 1 spray into both nostrils daily as needed.     ketoconazole (NIZORAL) 2 % shampoo Apply 1 Application topically 2 (two) times a week. 120 mL 1   MULTIPLE VITAMIN PO Take by mouth.     polyethylene glycol (MIRALAX / GLYCOLAX) packet Take 17 g by mouth daily.     Semaglutide-Weight Management 2.4 MG/0.75ML SOAJ Inject 2.4 mg into the skin once a week. 3 mL 0   No current facility-administered medications for this visit.   Allergies:  Shellfish allergy   Social History: The patient  reports that she has never smoked. She has never used smokeless tobacco. She reports current alcohol use. She reports that she does not use drugs.   Family History: The patient's family history includes Cancer in her father; Diabetes in her father, maternal aunt, and paternal aunt; Hyperlipidemia in her mother; Hypertension in her paternal aunt and sister; Hypothyroidism in her mother.   ROS:  Please see the history of present illness. Otherwise, complete review of systems is positive for none.  All other systems are reviewed and negative.  Physical Exam: VS:  BP 136/76   Pulse 78   Ht 5\' 3"  (1.6 m)   Wt 186 lb (84.4 kg)   SpO2 100%   BMI 32.95 kg/m , BMI Body mass index is 32.95 kg/m.  Wt Readings from Last 3 Encounters:  10/21/22 186 lb (84.4 kg)  09/22/22 188 lb 1.9 oz (85.3 kg)  08/12/22 196 lb (88.9 kg)    General: Patient appears comfortable at rest. HEENT: Conjunctiva and lids normal, oropharynx clear with moist mucosa. Neck: Supple, no elevated JVP or carotid bruits, no thyromegaly. Lungs: Clear to auscultation, nonlabored breathing at rest. Cardiac: Regular rate and rhythm, no S3  or significant systolic murmur, no pericardial rub. Abdomen: Soft, nontender, no hepatomegaly, bowel sounds present, no guarding or rebound. Extremities: No pitting edema, distal pulses 2+. Skin: Warm and dry. Musculoskeletal: No kyphosis. Neuropsychiatric: Alert and oriented x3, affect grossly appropriate.  Recent Labwork: 03/31/2022: TSH 2.48 08/04/2022: ALT 10; AST 16 08/12/2022: BUN 12; Creatinine, Ser 0.79; Hemoglobin 12.4; Platelets 442; Potassium 3.1; Sodium 137     Component Value Date/Time   CHOL 147 03/31/2022 0716   CHOL 177 05/25/2021 0929   TRIG 56 03/31/2022 0716   HDL 55 03/31/2022 0716   HDL 55 05/25/2021 0929   CHOLHDL 2.7 03/31/2022 0716   VLDL 16 06/05/2014 1004   LDLCALC 78 03/31/2022 0716     Assessment and Plan:   Noncardiac chest pain: Chest pain occurred when she was on vacation, felt like it was a twinge in her left chest radiating to her left nipple, lasting for a few seconds and resolving.  It happened for a few times prompting ER visit in 08/2022.  She had 1 more episode in 9/24 but no recurrences since then.  No chest pain with exertion.  This is noncardiac, this twinge in her left chest in the past could likely be secondary to muscle spasm.  No further cardiac workup is indicated at this time.  EKG from the ER reviewed, showed NSR and nonspecific ST depressions.  EKG today showed NSR and no evidence of ST depressions.   Medication Adjustments/Labs and Tests Ordered: Current medicines are reviewed at length with the patient today.  Concerns regarding medicines are outlined above.    Disposition:  Follow up PRN  Signed, Lechelle Wrigley Verne Spurr, MD, 10/21/2022 4:47 PM    [facility] [address], [place], [place] [address]

## 2022-10-25 MED ORDER — SEMAGLUTIDE-WEIGHT MANAGEMENT 1.7 MG/0.75ML ~~LOC~~ SOAJ: 1.7000 mg | SUBCUTANEOUS | 3 refills | Status: DC

## 2022-10-27 ENCOUNTER — Ambulatory Visit (INDEPENDENT_AMBULATORY_CARE_PROVIDER_SITE_OTHER): Payer: Self-pay | Admitting: Internal Medicine

## 2022-10-27 ENCOUNTER — Encounter: Payer: Self-pay | Admitting: Internal Medicine

## 2022-10-27 VITALS — BP 127/83 | HR 89 | Ht 63.0 in | Wt 183.8 lb

## 2022-10-27 DIAGNOSIS — J069 Acute upper respiratory infection, unspecified: Secondary | ICD-10-CM

## 2022-10-27 DIAGNOSIS — N760 Acute vaginitis: Secondary | ICD-10-CM

## 2022-10-27 MED ORDER — AZITHROMYCIN 250 MG PO TABS
ORAL_TABLET | ORAL | 0 refills | Status: AC
Start: 2022-10-27 — End: 2022-11-01

## 2022-10-27 MED ORDER — FLUCONAZOLE 150 MG PO TABS
150.0000 mg | ORAL_TABLET | Freq: Once | ORAL | 0 refills | Status: AC
Start: 2022-10-27 — End: 2022-10-27

## 2022-10-27 MED ORDER — NOREL AD 4-10-325 MG PO TABS
1.0000 | ORAL_TABLET | Freq: Three times a day (TID) | ORAL | 0 refills | Status: AC | PRN
Start: 2022-10-27 — End: ?

## 2022-10-27 NOTE — Progress Notes (Signed)
Acute Office Visit  Subjective:    Patient ID: [name], female    DOB: [birth year], 47 y.o.   MRN: [id number]  Chief Complaint  Patient presents with   URI    Uri symptoms since Sunday , ear pain , mucus different colors, sore throat, congestion, cough. Took covid test on Tuesday and was negative    Cyst    Knot on side of neck     HPI Patient is in today for complaint of rhinorrhea and mucoid discharge  for the last 5 days.  She also reports sinus pressure, cough and sore throat.  She has noted bilateral ear fullness as well.  She has noticed bump on the left side of the neck, apparently not painful.  Denies any dyspnea or wheezing.  Her home COVID test was negative about 3 days ago.  Past Medical History:  Diagnosis Date   Allergy    late Spring/early Summer   Chronic constipation    GERD (gastroesophageal reflux disease)    Hx of varicella    Hyperglycemia    IBS (irritable bowel syndrome)     Past Surgical History:  Procedure Laterality Date   BREAST REDUCTION SURGERY     BREAST SURGERY Bilateral 01/04/2000   reduction   bunnionectomy Left 01/04/2004   COLONOSCOPY     Per patient, colonoscopy in 2005 with [facility] revealing normal exam.   COLONOSCOPY N/A 01/20/2021   Procedure: COLONOSCOPY;  Surgeon: Corbin Ade, MD;  Location: [facility];  Service: Endoscopy;  Laterality: N/A;  9:30am   right ankle     TONSILLECTOMY  01/03/2002    Family History  Problem Relation Age of Onset   Hypothyroidism Mother    Hyperlipidemia Mother    Cancer Father        lung   Diabetes Father    Hypertension Sister    Diabetes Maternal Aunt    Hypertension Paternal Aunt    Diabetes Paternal Aunt    Colon cancer Neg Hx    Colon polyps Neg Hx     Social History   Socioeconomic History   Marital status: Single    Spouse name: Not on file   Number of children: Not on file   Years of education: Not on file   Highest education level: Bachelor's degree  (e.g., BA, AB, BS)  Occupational History   Not on file  Tobacco Use   Smoking status: Never   Smokeless tobacco: Never  Vaping Use   Vaping status: Never Used  Substance and Sexual Activity   Alcohol use: Yes    Comment: occ- 1-2 drinks a month.   Drug use: No   Sexual activity: Yes    Birth control/protection: None  Other Topics Concern   Not on file  Social History Narrative   Not on file   Social Determinants of Health   Financial Resource Strain: Low Risk  (04/05/2022)   Overall Financial Resource Strain (CARDIA)    Difficulty of Paying Living Expenses: Not hard at all  Food Insecurity: No Food Insecurity (04/05/2022)   Hunger Vital Sign    Worried About Running Out of Food in the Last Year: Never true    Ran Out of Food in the Last Year: Never true  Transportation Needs: No Transportation Needs (04/05/2022)   PRAPARE - Administrator, Civil Service (Medical): No    Lack of Transportation (Non-Medical): No  Physical Activity: Insufficiently Active (04/05/2022)   Exercise Vital  Sign    Days of Exercise per Week: 3 days    Minutes of Exercise per Session: 30 min  Stress: No Stress Concern Present (04/05/2022)   [employer] of Occupational Health - Occupational Stress Questionnaire    Feeling of Stress : Not at all  Social Connections: Moderately Integrated (04/05/2022)   Social Connection and Isolation Panel [NHANES]    Frequency of Communication with Friends and Family: More than three times a week    Frequency of Social Gatherings with Friends and Family: Twice a week    Attends Religious Services: More than 4 times per year    Active Member of [employer] or Organizations: Yes    Attends Engineer, structural: More than 4 times per year    Marital Status: Never married  Intimate Partner Violence: Not on file    Outpatient Medications Prior to Visit  Medication Sig Dispense Refill   Cholecalciferol (VITAMIN D3) 1000 units CAPS Take by mouth.      clotrimazole-betamethasone (LOTRISONE) cream Apply 1 Application topically 2 (two) times daily. 45 g 1   ferrous sulfate 325 (65 FE) MG EC tablet Take 1 tablet (325 mg total) by mouth daily with breakfast. 30 tablet 3   fluticasone (FLONASE) 50 MCG/ACT nasal spray Place 1 spray into both nostrils daily as needed.     ketoconazole (NIZORAL) 2 % shampoo Apply 1 Application topically 2 (two) times a week. 120 mL 1   MULTIPLE VITAMIN PO Take by mouth.     polyethylene glycol (MIRALAX / GLYCOLAX) packet Take 17 g by mouth daily.     [START ON 01/20/2023] Semaglutide-Weight Management 1.7 MG/0.75ML SOAJ Inject 1.7 mg into the skin once a week. 3 mL 3   No facility-administered medications prior to visit.    Allergies  Allergen Reactions   Shellfish Allergy Hives and Shortness Of Breath    Review of Systems  Constitutional:  Negative for chills and fever.  HENT:  Positive for ear pain, rhinorrhea, sinus pressure and sore throat. Negative for ear discharge.   Eyes:  Negative for pain and discharge.  Respiratory:  Positive for cough. Negative for shortness of breath.   Cardiovascular:  Negative for chest pain and palpitations.  Gastrointestinal:  Negative for abdominal pain, diarrhea, nausea and vomiting.  Endocrine: Negative for polydipsia and polyuria.  Genitourinary:  Negative for dysuria and hematuria.  Musculoskeletal:  Negative for neck pain and neck stiffness.  Skin:  Negative for rash.  Neurological:  Negative for dizziness and weakness.  Psychiatric/Behavioral:  Negative for agitation and behavioral problems.        Objective:    Physical Exam Vitals reviewed.  Constitutional:      General: She is not in acute distress.    Appearance: She is not diaphoretic.  HENT:     Head: Normocephalic and atraumatic.     Right Ear: Ear canal and external ear normal. There is no impacted cerumen.     Left Ear: Ear canal and external ear normal. There is no impacted cerumen.     Nose:  Congestion present.     Mouth/Throat:     Mouth: Mucous membranes are moist.     Pharynx: Posterior oropharyngeal erythema present.  Eyes:     General: No scleral icterus.    Extraocular Movements: Extraocular movements intact.  Cardiovascular:     Rate and Rhythm: Normal rate and regular rhythm.     Heart sounds: Normal heart sounds. No murmur heard. Pulmonary:  Breath sounds: Normal breath sounds. No wheezing or rales.  Musculoskeletal:     Cervical back: Neck supple. No tenderness.     Right lower leg: No edema.     Left lower leg: No edema.  Lymphadenopathy:     Cervical: Cervical adenopathy (Left sided) present.  Skin:    General: Skin is warm.     Findings: No rash.  Neurological:     General: No focal deficit present.     Mental Status: She is alert and oriented to person, place, and time.  Psychiatric:        Mood and Affect: Mood normal.        Behavior: Behavior normal.     BP 127/83 (BP Location: Right Arm, Patient Position: Sitting, Cuff Size: Normal)   Pulse 89   Ht 5\' 3"  (1.6 m)   Wt 183 lb 12.8 oz (83.4 kg)   SpO2 97%   BMI 32.56 kg/m  Wt Readings from Last 3 Encounters:  10/27/22 183 lb 12.8 oz (83.4 kg)  10/21/22 186 lb (84.4 kg)  09/22/22 188 lb 1.9 oz (85.3 kg)        Assessment & Plan:   Problem List Items Addressed This Visit       Respiratory   URTI (acute upper respiratory infection) - Primary    Started empiric azithromycin considering persistent symptoms Norel as needed for nasal congestion Can use Flonase for nasal congestion/allergies Likely has reactive cervical lymph node enlargement - if persistent, will need imaging Fluconazole for candidal vaginitis PPx      Relevant Medications   Chlorphen-PE-Acetaminophen (NOREL AD) 4-10-325 MG TABS   azithromycin (ZITHROMAX) 250 MG tablet   Other Visit Diagnoses     Acute vaginitis            Meds ordered this encounter  Medications   Chlorphen-PE-Acetaminophen (NOREL AD)  4-10-325 MG TABS    Sig: Take 1 tablet by mouth 3 (three) times daily as needed (Nasal congestion).    Dispense:  30 tablet    Refill:  0   fluconazole (DIFLUCAN) 150 MG tablet    Sig: Take 1 tablet (150 mg total) by mouth once for 1 dose.    Dispense:  1 tablet    Refill:  0   azithromycin (ZITHROMAX) 250 MG tablet    Sig: Take 2 tablets on day 1, then 1 tablet daily on days 2 through 5    Dispense:  6 tablet    Refill:  0     Signora Zucco Concha Se, MD

## 2022-10-27 NOTE — Patient Instructions (Signed)
Please start taking Norel as needed for runny nose and congestion.  Okay to take Mucinex as needed for cough.  If your symptoms do not start improving in 2 days, please start Azithromycin.

## 2022-10-28 NOTE — Assessment & Plan Note (Addendum)
Started empiric azithromycin considering persistent symptoms Norel as needed for nasal congestion Can use Flonase for nasal congestion/allergies Likely has reactive cervical lymph node enlargement - if persistent, will need imaging Fluconazole for candidal vaginitis PPx

## 2022-11-28 ENCOUNTER — Ambulatory Visit (HOSPITAL_COMMUNITY)
Admission: RE | Admit: 2022-11-28 | Discharge: 2022-11-28 | Disposition: A | Discharge status: Home / Self Care | Payer: Self-pay | Source: Ambulatory Visit | Attending: Family Medicine | Admitting: Family Medicine

## 2022-11-28 DIAGNOSIS — Z1231 Encounter for screening mammogram for malignant neoplasm of breast: Secondary | ICD-10-CM | POA: Insufficient documentation

## 2022-12-08 ENCOUNTER — Ambulatory Visit (INDEPENDENT_AMBULATORY_CARE_PROVIDER_SITE_OTHER): Payer: Self-pay | Admitting: Family Medicine

## 2022-12-08 VITALS — BP 131/85 | HR 93 | Ht 63.0 in | Wt 177.0 lb

## 2022-12-08 DIAGNOSIS — J0110 Acute frontal sinusitis, unspecified: Secondary | ICD-10-CM

## 2022-12-08 MED ORDER — AMOXICILLIN-POT CLAVULANATE 875-125 MG PO TABS
1.0000 | ORAL_TABLET | Freq: Two times a day (BID) | ORAL | 0 refills | Status: AC
Start: 2022-12-08 — End: 2022-12-15

## 2022-12-08 MED ORDER — PREDNISONE 20 MG PO TABS
20.0000 mg | ORAL_TABLET | Freq: Two times a day (BID) | ORAL | 0 refills | Status: AC
Start: 2022-12-08 — End: 2022-12-13

## 2022-12-08 MED ORDER — AZELASTINE-FLUTICASONE 137-50 MCG/ACT NA SUSP: 1.0000 | Freq: Two times a day (BID) | NASAL | 1 refills | Status: DC

## 2022-12-08 NOTE — Patient Instructions (Addendum)
        Great to see you today.  I have refilled the medication(s) we provide.    - Please take medications as prescribed. - Follow up with your primary health provider if any health concerns arises. - If symptoms worsen please contact your primary care provider and/or visit the emergency department.  

## 2022-12-08 NOTE — Assessment & Plan Note (Signed)
Prednisone 20 mg twice day x 5 days , Azelastine-fluticasone nasal spray PRN,Augmentin 875-125 mg twice daily x 7 days Advise patient to rest to support your body's recovery. Stay hydrated by drinking water, tea, or broth. Using a humidifier can help soothe throat irritation and ease nasal congestion. For fever or pain, acetaminophen (Tylenol) is recommended. To relieve other symptoms, try saline nasal sprays, throat lozenges, or gargling with saltwater. Focus on eating light, healthy meals like fruits and vegetables to keep your strength up. Practice good hygiene by washing your hands frequently and covering your mouth when coughing or sneezing.Follow-up for worsening or persistent symptoms. Patient verbalizes understanding regarding plan of care and all questions answered

## 2022-12-08 NOTE — Progress Notes (Signed)
Established Patient Office Visit   Subjective  Patient ID: [name], female    DOB: 10-29-1975  Age: 47 y.o. MRN: [id number]  Chief Complaint  Patient presents with   Acute Visit    possible sinutis infection yellow thick mucus and drainage, slight sore throat , sinus pressure over right eye, around nasal w/ headache, no fever had a negative covid test    She  has a past medical history of Allergy, Chronic constipation, GERD (gastroesophageal reflux disease), varicella, Hyperglycemia, and IBS (irritable bowel syndrome).  Patient reports nasal drainage characterized by yellow and brown mucus, accompanied by sinus and ear pressure, throat irritation, and painful swallowing. Additional symptoms include fatigue, headache, malaise, and a sore throat. Symptoms began a few days ago and have progressively worsened since onset. The patient denies experiencing cough, shortness of breath, chest pain, or nausea and vomiting. Current treatments include nasal saline, tylenol, and an antitussive, which have provided partial relief. The patient's past pulmonary history with prior episodes of pneumonia or chronic bronchitis.      Review of Systems  Constitutional:  Positive for malaise/fatigue. Negative for chills and fever.  HENT:  Positive for congestion, sinus pain and sore throat.   Eyes:  Negative for blurred vision.  Respiratory:  Negative for shortness of breath.   Cardiovascular:  Negative for chest pain.  Neurological:  Positive for headaches. Negative for dizziness.      Objective:     BP 131/85   Pulse 93   Ht 5\' 3"  (1.6 m)   Wt 177 lb 0.6 oz (80.3 kg)   LMP 11/16/2022   SpO2 99%   BMI 31.36 kg/m  BP Readings from Last 3 Encounters:  12/08/22 131/85  10/27/22 127/83  10/21/22 136/76      Physical Exam Vitals reviewed.  Constitutional:      General: She is not in acute distress.    Appearance: Normal appearance. She is not ill-appearing, toxic-appearing or  diaphoretic.  HENT:     Head: Normocephalic.     Right Ear: Tympanic membrane is erythematous.     Left Ear: Tympanic membrane is erythematous.     Nose: Congestion and rhinorrhea present.     Mouth/Throat:     Pharynx: Posterior oropharyngeal erythema present.  Eyes:     General:        Right eye: No discharge.        Left eye: No discharge.     Conjunctiva/sclera: Conjunctivae normal.  Cardiovascular:     Rate and Rhythm: Normal rate.     Pulses: Normal pulses.     Heart sounds: Normal heart sounds.  Pulmonary:     Effort: Pulmonary effort is normal. No respiratory distress.     Breath sounds: Normal breath sounds.  Musculoskeletal:        General: Normal range of motion.     Cervical back: Normal range of motion.  Skin:    General: Skin is warm and dry.     Capillary Refill: Capillary refill takes less than 2 seconds.  Neurological:     Mental Status: She is alert.     Coordination: Coordination normal.     Gait: Gait normal.  Psychiatric:        Mood and Affect: Mood normal.        Behavior: Behavior normal.      No results found for any visits on 12/08/22.  The 10-year ASCVD risk score (Arnett DK, et al., 2019) is: 1.1%  Assessment & Plan:  Acute non-recurrent frontal sinusitis Assessment & Plan: Prednisone 20 mg twice day x 5 days , Azelastine-fluticasone nasal spray PRN,Augmentin 875-125 mg twice daily x 7 days Advise patient to rest to support your body's recovery. Stay hydrated by drinking water, tea, or broth. Using a humidifier can help soothe throat irritation and ease nasal congestion. For fever or pain, acetaminophen (Tylenol) is recommended. To relieve other symptoms, try saline nasal sprays, throat lozenges, or gargling with saltwater. Focus on eating light, healthy meals like fruits and vegetables to keep your strength up. Practice good hygiene by washing your hands frequently and covering your mouth when coughing or sneezing.Follow-up for worsening or  persistent symptoms. Patient verbalizes understanding regarding plan of care and all questions answered    Orders: -     predniSONE; Take 1 tablet (20 mg total) by mouth 2 (two) times daily with a meal for 5 days.  Dispense: 10 tablet; Refill: 0 -     Azelastine-Fluticasone; Place 1 spray into the nose every 12 (twelve) hours.  Dispense: 23 g; Refill: 1 -     Amoxicillin-Pot Clavulanate; Take 1 tablet by mouth 2 (two) times daily for 7 days.  Dispense: 14 tablet; Refill: 0    Return if symptoms worsen or fail to improve.   Cruzita Lederer Newman Nip, FNP

## 2022-12-30 ENCOUNTER — Ambulatory Visit (HOSPITAL_COMMUNITY): Payer: PRIVATE HEALTH INSURANCE

## 2022-12-30 ENCOUNTER — Ambulatory Visit: Payer: PRIVATE HEALTH INSURANCE | Admitting: Family Medicine

## 2023-02-13 ENCOUNTER — Other Ambulatory Visit: Payer: Self-pay | Admitting: Family Medicine

## 2023-02-15 ENCOUNTER — Inpatient Hospital Stay: Payer: Self-pay

## 2023-02-18 IMAGING — MG MM DIGITAL SCREENING BILAT W/ TOMO AND CAD
6 of 10 series · 6 of 30 positions shown · non-contrast
Comparison: Previous exam(s).

ACR Breast Density Category a: The breast tissue is almost entirely
fatty.

CLINICAL DATA: Screening.

EXAM:
DIGITAL SCREENING BILATERAL MAMMOGRAM WITH TOMOSYNTHESIS AND CAD
TECHNIQUE: Bilateral screening digital craniocaudal and mediolateral oblique
mammograms were obtained. Bilateral screening digital breast
tomosynthesis was performed. The images were evaluated with
computer-aided detection.

[R MLO synth-2D]
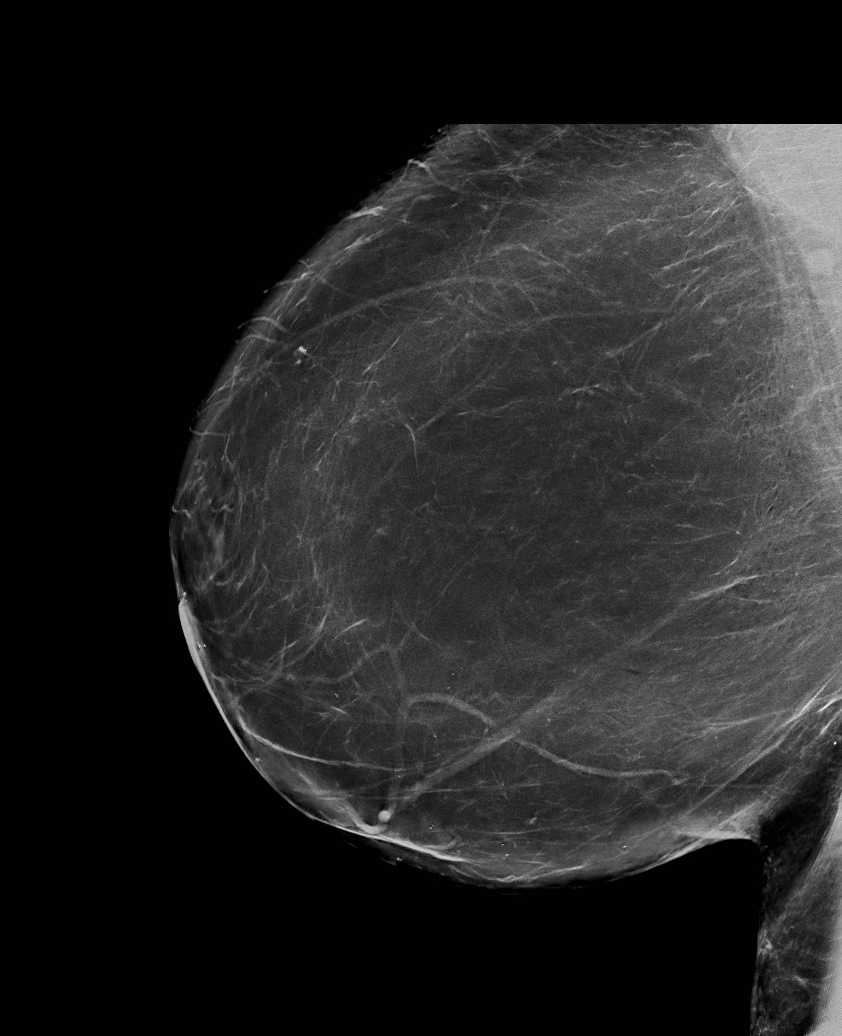

[R CC synth-2D]
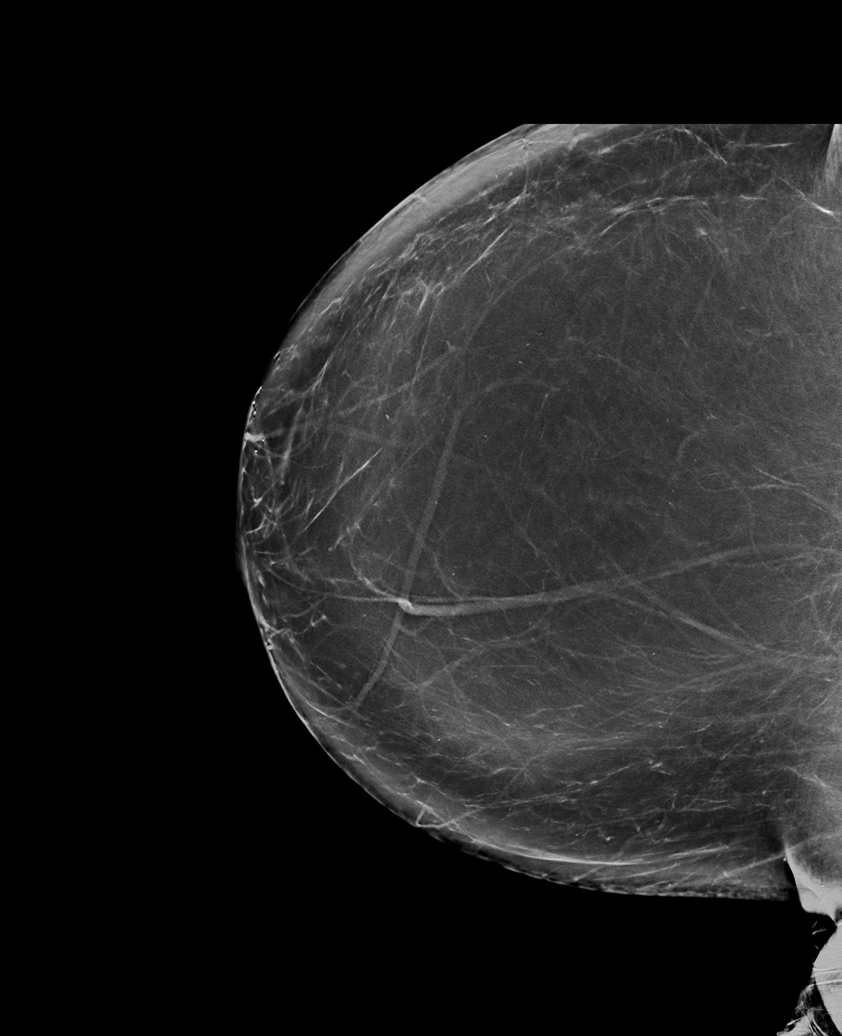

[L MLO synth-2D (1 of 2)]
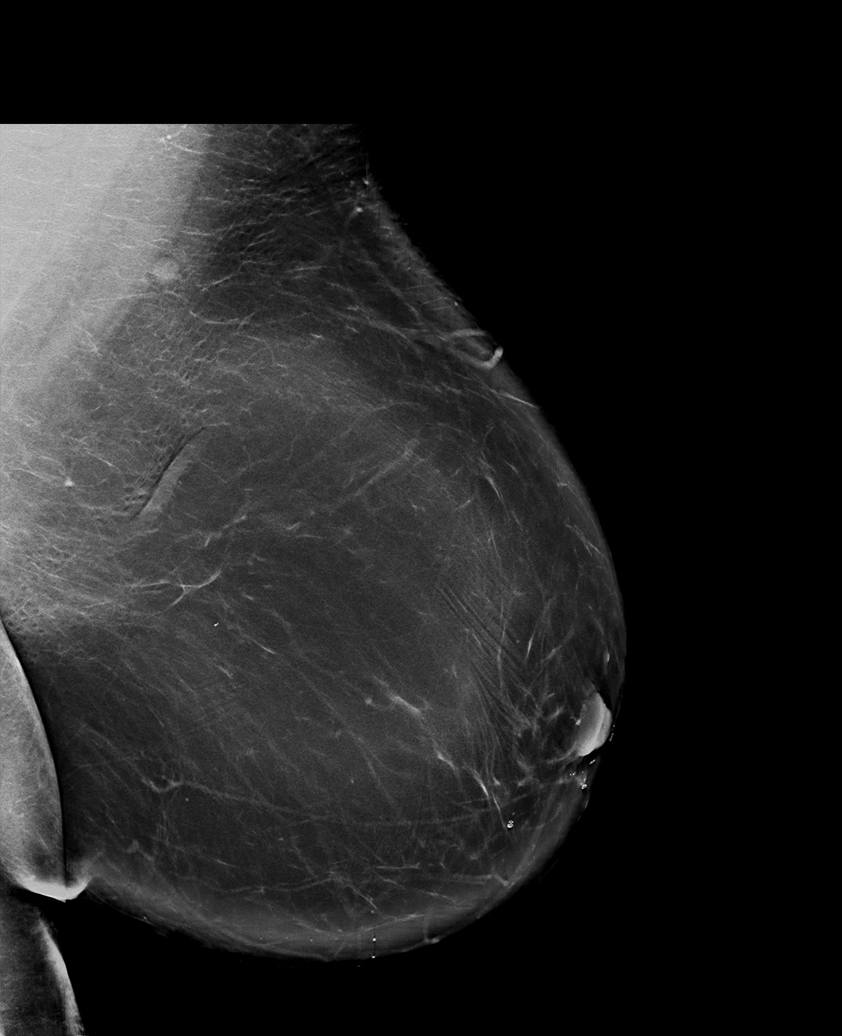

[L CC synth-2D]
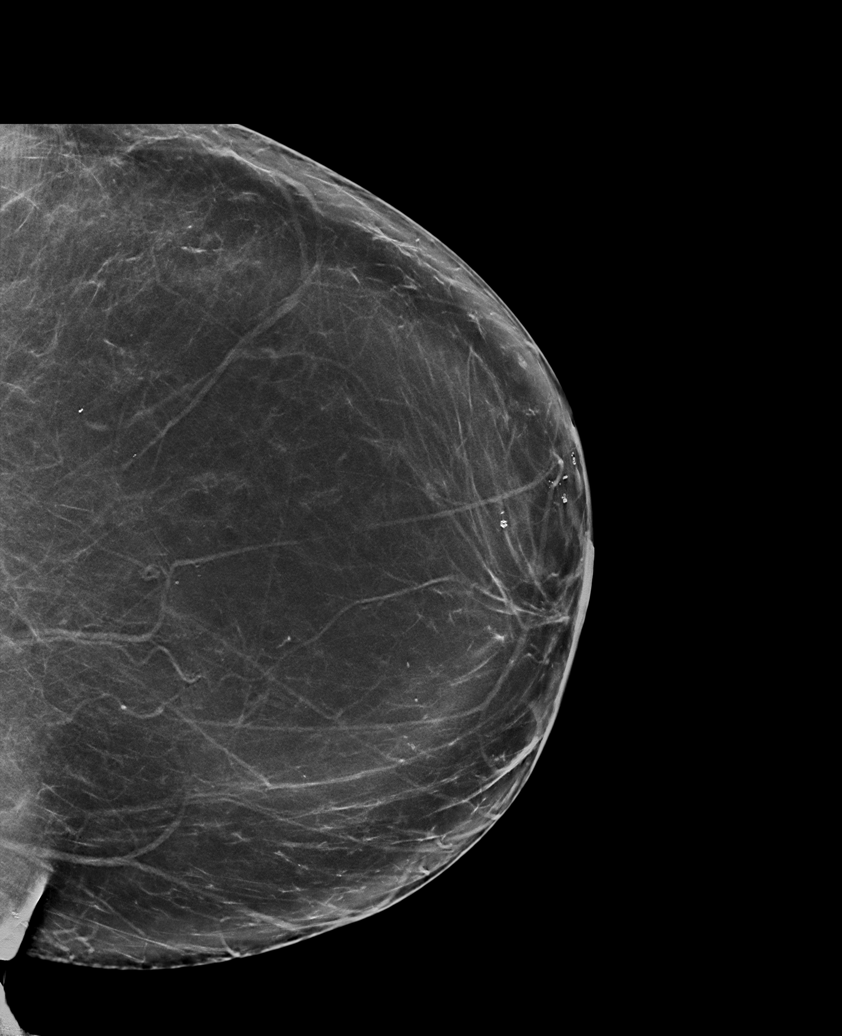

[L MLO synth-2D (2 of 2)]
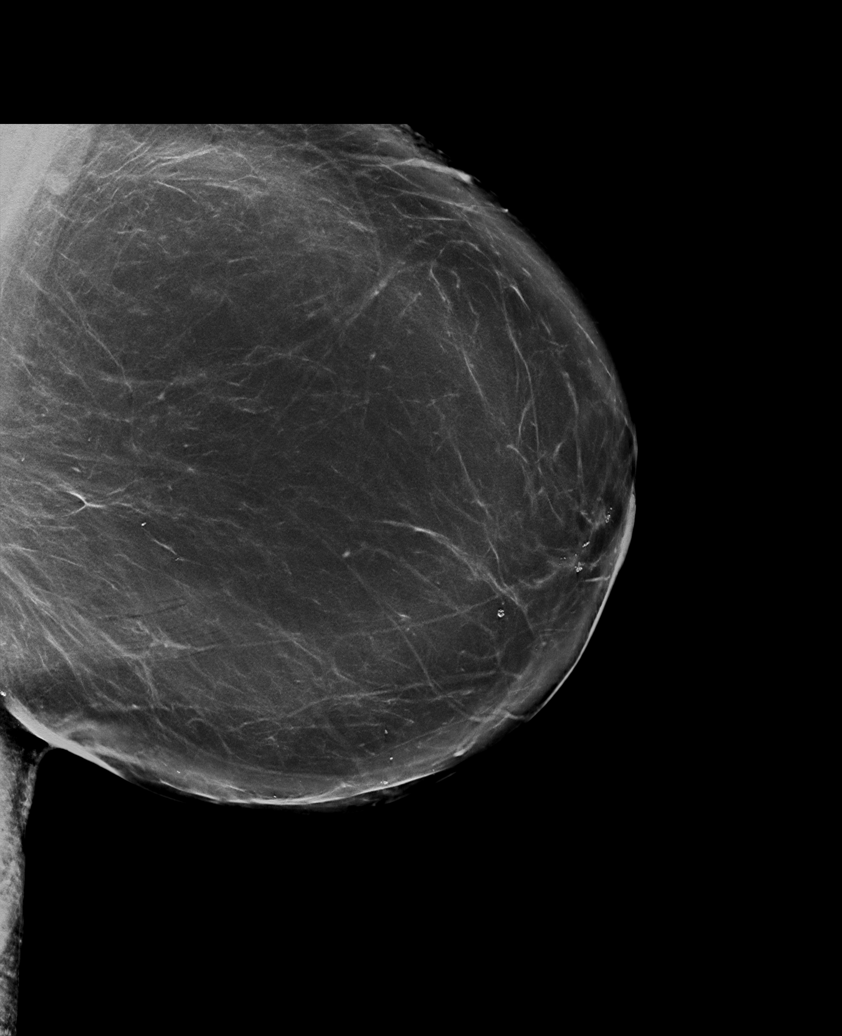

[R MLO tomo · tomo slice 50/99.0]
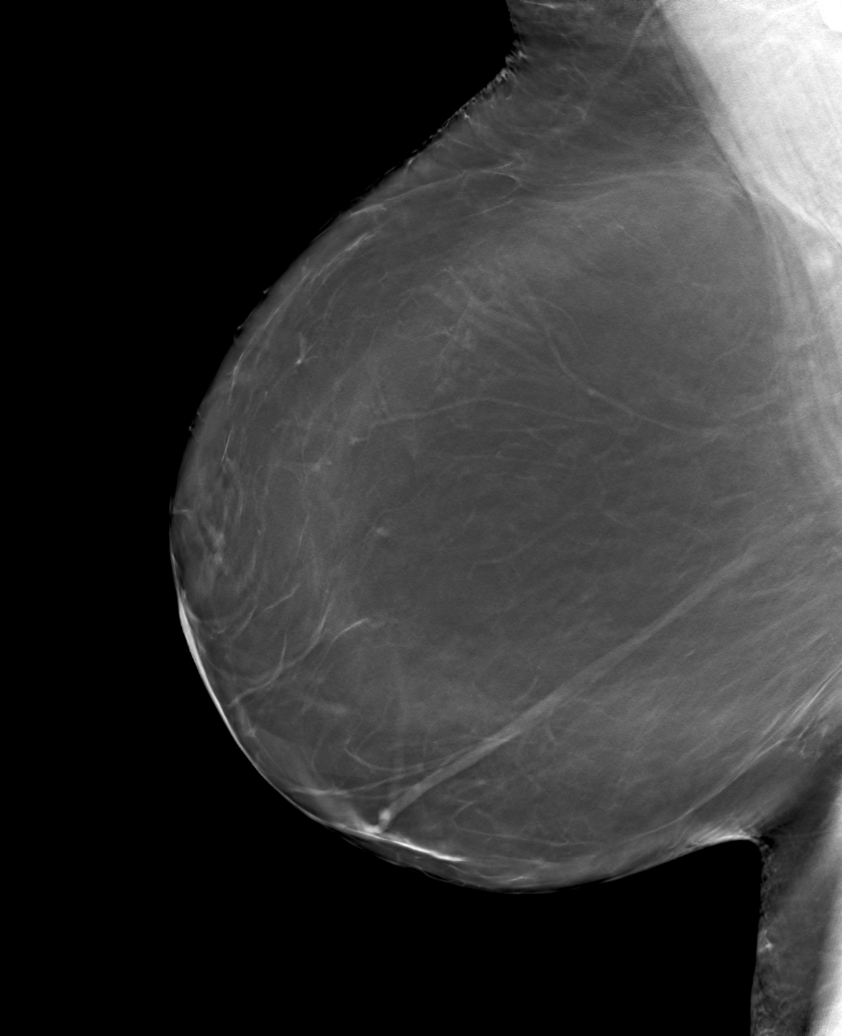

[6 of 30 positions shown; findings below may reference images not displayed]

FINDINGS: There are no findings suspicious for malignancy.
IMPRESSION: No mammographic evidence of malignancy. A result letter of this
screening mammogram will be mailed directly to the patient.

RECOMMENDATION:
Screening mammogram in one year. (Code:0E-3-N98)

BI-RADS CATEGORY  1: Negative.

## 2023-02-20 ENCOUNTER — Other Ambulatory Visit: Payer: Self-pay | Admitting: Family Medicine

## 2023-02-20 NOTE — Telephone Encounter (Signed)
Duplicate, Surescripts requested refill on 2/10, 2/12 and 2/14. See 02/13/23 refill encounter. Has been routed to [facility] by CMA.

## 2023-02-20 NOTE — Telephone Encounter (Signed)
Copied from CRM [id number]. Topic: Clinical - Medication Refill >> Feb 20, 2023 11:51 AM Shelah Lewandowsky wrote: Most Recent Primary Care Visit:  Provider: Rica Records  Department: [facility]  Visit Type: OFFICE VISIT  Date: 12/08/2022  Medication: Semaglutide-Weight Management 1.7 MG/0.75ML SOAJ  Has the patient contacted their pharmacy? No (Agent: If no, request that the patient contact the pharmacy for the refill. If patient does not wish to contact the pharmacy document the reason why and proceed with request.) (Agent: If yes, when and what did the pharmacy advise?)  Is this the correct pharmacy for this prescription? Yes If no, delete pharmacy and type the correct one.  This is the patient's preferred pharmacy:  [facility] [address] [place] - [address] [place] [address] [place] [address] Phone: [phone] Fax: [phone]    Has the prescription been filled recently? Yes  Is the patient out of the medication? Yes  Has the patient been seen for an appointment in the last year OR does the patient have an upcoming appointment? Yes  Can we respond through MyChart? Yes  Agent: Please be advised that Rx refills may take up to 3 business days. We ask that you follow-up with your pharmacy.

## 2023-02-22 ENCOUNTER — Inpatient Hospital Stay: Payer: Self-pay | Admitting: Physician Assistant

## 2023-02-27 ENCOUNTER — Other Ambulatory Visit: Payer: Self-pay | Admitting: Family Medicine

## 2023-02-27 NOTE — Telephone Encounter (Signed)
 Copied from CRM [id number]. Topic: Clinical - Medication Refill >> Feb 27, 2023 11:58 AM Kristie Cowman wrote: Most Recent Primary Care Visit:  Provider: Rica Records  Department: [facility]  Visit Type: OFFICE VISIT  Date: 12/08/2022  Medication: Theresa Duty  Has the patient contacted their pharmacy? No (Agent: If no, request that the patient contact the pharmacy for the refill. If patient does not wish to contact the pharmacy document the reason why and proceed with request.) (Agent: If yes, when and what did the pharmacy advise?)  Is this the correct pharmacy for this prescription? Yes If no, delete pharmacy and type the correct one.  This is the patient's preferred pharmacy:  [facility] [address] [place] - [address] [place] [address]  [place] [address] Phone: [phone] Fax: [phone]  Has the prescription been filled recently? No  Is the patient out of the medication? Yes  Has the patient been seen for an appointment in the last year OR does the patient have an upcoming appointment? Yes  Can we respond through MyChart? Yes  Agent: Please be advised that Rx refills may take up to 3 business days. We ask that you follow-up with your pharmacy.

## 2023-02-27 NOTE — Telephone Encounter (Signed)
 Last Fill: 01/20/23  Last OV: 12/08/22 Next OV: 03/02/23  Routing to provider for review/authorization.

## 2023-03-02 ENCOUNTER — Encounter: Payer: Self-pay | Admitting: Family Medicine

## 2023-03-02 ENCOUNTER — Ambulatory Visit: Payer: PRIVATE HEALTH INSURANCE | Admitting: Family Medicine

## 2023-03-02 VITALS — BP 137/82 | HR 77 | Ht 63.0 in | Wt 174.0 lb

## 2023-03-02 DIAGNOSIS — E66811 Obesity, class 1: Secondary | ICD-10-CM

## 2023-03-02 DIAGNOSIS — Z23 Encounter for immunization: Secondary | ICD-10-CM | POA: Diagnosis not present

## 2023-03-02 MED ORDER — SEMAGLUTIDE-WEIGHT MANAGEMENT 1.7 MG/0.75ML ~~LOC~~ SOAJ: 1.7000 mg | SUBCUTANEOUS | 3 refills | Status: AC

## 2023-03-02 MED ORDER — CLOTRIMAZOLE-BETAMETHASONE 1-0.05 % EX CREA: 1.0000 | TOPICAL_CREAM | Freq: Two times a day (BID) | CUTANEOUS | 1 refills | Status: AC

## 2023-03-02 NOTE — Progress Notes (Signed)
   [name]     MRN: [id number]      DOB: 11/17/1975  Chief Complaint  Patient presents with   Weight Management    HPI [name] is here for follow up and re-evaluation of chronic medical conditions, medication management and review of any available recent lab and radiology data.  Preventive health is updated, specifically  Cancer screening and Immunization.   DID SEE cARDIOLOGY FOR ED FOLLOW UP FOR CHEST PAIN, DEEMED NON CARDIAC dOING EXTREMELY WELL OPN WEGOVY AND FEELS AS THOUGH SHE IS AT A WEIGHT SHE WANTS TO STAY AT ROS Denies recent fever or chills. Denies sinus pressure, nasal congestion, ear pain or sore throat. Denies chest congestion, productive cough or wheezing. Denies chest pains, palpitations and leg swelling Denies abdominal pain, nausea, vomiting,diarrhea or constipation.   Denies dysuria, frequency, hesitancy or incontinence. Denies joint pain, swelling and limitation in mobility. Denies headaches, seizures, numbness, or tingling. Denies depression, anxiety or insomnia. Denies skin break down or rash.   PE  BP 137/82   Pulse 77   Ht 5\' 3"  (1.6 m)   Wt 174 lb (78.9 kg)   LMP 02/08/2023   SpO2 98%   BMI 30.82 kg/m   Patient alert and oriented and in no cardiopulmonary distress.  HEENT: No facial asymmetry, EOMI,     Neck supple .  Chest: Clear to auscultation bilaterally.  CVS: S1, S2 no murmurs, no S3.Regular rate.  ABD: Soft non tender.   Ext: No edema  MS: Adequate ROM spine, shoulders, hips and knees.  Skin: Intact, no ulcerations or rash noted.  Psych: Good eye contact, normal affect. Memory intact not anxious or depressed appearing.  CNS: CN 2-12 intact, power,  normal throughout.no focal deficits noted.   Assessment & Plan  Obesity (BMI 30.0-34.9)  Patient re-educated about  the importance of commitment to a  minimum of 150 minutes of exercise per week as able.  The importance of healthy food choices with portion  control discussed, as well as eating regularly and within a 12 hour window most days. The need to choose "clean , green" food 50 to 75% of the time is discussed, as well as to make water the primary drink and set a goal of 64 ounces water daily.       03/02/2023    8:13 AM 12/08/2022    9:05 AM 10/27/2022   10:46 AM  Weight /BMI  Weight 174 lb 177 lb 0.6 oz 183 lb 12.8 oz  Height 5\' 3"  (1.6 m) 5\' 3"  (1.6 m) 5\' 3"  (1.6 m)  BMI 30.82 kg/m2 31.36 kg/m2 32.56 kg/m2    Continue med as before  Encounter for immunization After obtaining informed consent, the INFLUENZA vaccine is  administered , with no adverse effect noted at the time of administration.

## 2023-03-02 NOTE — Patient Instructions (Addendum)
 F/U in 4 months call if yyou need me sooner  CONGRATS  on excellent weight loss and great health habits  No med changes    Labs today  Flu vaccine today  It is important that you exercise regularly at least 30 minutes 5 times a week. If you develop chest pain, have severe difficulty breathing, or feel very tired, stop exercising immediately and seek medical attention   Thanks for choosing [facility], we consider it a privelige to serve you.

## 2023-03-02 NOTE — Assessment & Plan Note (Signed)
  Patient re-educated about  the importance of commitment to a  minimum of 150 minutes of exercise per week as able.  The importance of healthy food choices with portion control discussed, as well as eating regularly and within a 12 hour window most days. The need to choose "clean , green" food 50 to 75% of the time is discussed, as well as to make water the primary drink and set a goal of 64 ounces water daily.       03/02/2023    8:13 AM 12/08/2022    9:05 AM 10/27/2022   10:46 AM  Weight /BMI  Weight 174 lb 177 lb 0.6 oz 183 lb 12.8 oz  Height 5\' 3"  (1.6 m) 5\' 3"  (1.6 m) 5\' 3"  (1.6 m)  BMI 30.82 kg/m2 31.36 kg/m2 32.56 kg/m2    Continue med as before

## 2023-03-02 NOTE — Assessment & Plan Note (Signed)
 After obtaining informed consent, the INFLUENZA vaccine is  administered , with no adverse effect noted at the time of administration.

## 2023-03-06 ENCOUNTER — Telehealth: Payer: Self-pay | Admitting: Family Medicine

## 2023-03-06 NOTE — Telephone Encounter (Signed)
 Copied from CRM [id number]. Topic: Clinical - Medication Refill >> Mar 06, 2023 12:41 PM Everette Rank wrote: Most Recent Primary Care Visit:  Provider: Kerri Perches  Department: [facility]  Visit Type: OFFICE VISIT  Date: 03/02/2023  Medication: ketoconazole (NIZORAL) 2 % shampoo  Has the patient contacted their pharmacy? Yes (Agent: If no, request that the patient contact the pharmacy for the refill. If patient does not wish to contact the pharmacy document the reason why and proceed with request.) (Agent: If yes, when and what did the pharmacy advise?)  Is this the correct pharmacy for this prescription? Yes If no, delete pharmacy and type the correct one.  This is the patient's preferred pharmacy:  [facility] [address] [place] - [address] [place] [address] Phone: [phone] Fax: [phone]  [id number] - [place], [place] - [address] [place] [address] Phone: [phone] Fax: [phone]   Has the prescription been filled recently? No  Is the patient out of the medication? Yes  Has the patient been seen for an appointment in the last year OR does the patient have an upcoming appointment? Yes  Can we respond through MyChart? Yes  Agent: Please be advised that Rx refills may take up to 3 business days. We ask that you follow-up with your pharmacy.

## 2023-03-07 ENCOUNTER — Other Ambulatory Visit: Payer: Self-pay

## 2023-03-07 MED ORDER — KETOCONAZOLE 2 % EX SHAM: 1.0000 | MEDICATED_SHAMPOO | CUTANEOUS | 1 refills | Status: AC

## 2023-03-07 NOTE — Telephone Encounter (Signed)
 Sent to pharmacy

## 2023-04-17 ENCOUNTER — Encounter: Payer: Self-pay | Admitting: Family Medicine

## 2023-04-18 ENCOUNTER — Inpatient Hospital Stay: Payer: PRIVATE HEALTH INSURANCE | Attending: Hematology

## 2023-04-18 DIAGNOSIS — N920 Excessive and frequent menstruation with regular cycle: Secondary | ICD-10-CM | POA: Diagnosis not present

## 2023-04-18 DIAGNOSIS — E538 Deficiency of other specified B group vitamins: Secondary | ICD-10-CM

## 2023-04-18 DIAGNOSIS — D75839 Thrombocytosis, unspecified: Secondary | ICD-10-CM | POA: Diagnosis present

## 2023-04-18 DIAGNOSIS — E611 Iron deficiency: Secondary | ICD-10-CM | POA: Diagnosis not present

## 2023-04-18 LAB — CBC WITH DIFFERENTIAL/PLATELET
Abs Immature Granulocytes: 0.01 10*3/uL (ref 0.00–0.07)
Basophils Absolute: 0.1 10*3/uL (ref 0.0–0.1)
Basophils Relative: 1 %
Eosinophils Absolute: 0.2 10*3/uL (ref 0.0–0.5)
Eosinophils Relative: 2 %
HCT: 40.7 % (ref 36.0–46.0)
Hemoglobin: 12.6 g/dL (ref 12.0–15.0)
Immature Granulocytes: 0 %
Lymphocytes Relative: 33 %
Lymphs Abs: 2.8 10*3/uL (ref 0.7–4.0)
MCH: 26.6 pg (ref 26.0–34.0)
MCHC: 31 g/dL (ref 30.0–36.0)
MCV: 86 fL (ref 80.0–100.0)
Monocytes Absolute: 0.5 10*3/uL (ref 0.1–1.0)
Monocytes Relative: 6 %
Neutro Abs: 4.9 10*3/uL (ref 1.7–7.7)
Neutrophils Relative %: 58 %
Platelets: 431 10*3/uL — ABNORMAL HIGH (ref 150–400)
RBC: 4.73 MIL/uL (ref 3.87–5.11)
RDW: 14.3 % (ref 11.5–15.5)
WBC: 8.5 10*3/uL (ref 4.0–10.5)
nRBC: 0 % (ref 0.0–0.2)

## 2023-04-18 LAB — IRON AND TIBC
Iron: 87 ug/dL (ref 28–170)
Saturation Ratios: 27 % (ref 10.4–31.8)
TIBC: 328 ug/dL (ref 250–450)
UIBC: 241 ug/dL

## 2023-04-18 LAB — VITAMIN B12: Vitamin B-12: 367 pg/mL (ref 180–914)

## 2023-04-18 LAB — FERRITIN: Ferritin: 35 ng/mL (ref 11–307)

## 2023-04-21 LAB — METHYLMALONIC ACID, SERUM: Methylmalonic Acid, Quantitative: 102 nmol/L (ref 0–378)

## 2023-04-24 NOTE — Progress Notes (Unsigned)
 VIRTUAL VISIT via TELEPHONE NOTE [facility]   I connected with [name]  on 04/25/23 at  8:20 AM by telephone and verified that I am speaking with the correct person using two identifiers.  Location: Patient: Home Provider: [facility]   I discussed the limitations, risks, security and privacy concerns of performing an evaluation and management service by telephone and the availability of in person appointments. I also discussed with the patient that there may be a patient responsible charge related to this service. The patient expressed understanding and agreed to proceed.  REASON FOR VISIT:  Follow-up for thrombocytosis and iron deficiency   CURRENT THERAPY: Ferrous sulfate   INTERVAL HISTORY:  [name] is contacted today for follow-up of thrombocytosis and iron deficiency.  She was last seen by Sheril Dines PA-C on 08/15/2022.  She was taking oral iron daily and vitamin B12 500 mcg every other day since her last visit, but stopped taking all of her vitamins and supplements about 2 months ago.  At today's visit, she reports feeling well.  She continues to report heavy menses, but denies any rectal bleeding or melena.  She denies any vasomotor symptoms, aquagenic pruritus, or vasomotor symptoms.  No B symptoms or recurrent infections.  She has 100% energy and 100% appetite. She endorses that she is maintaining a stable weight.  ASSESSMENT & PLAN:  1.  Thrombocytosis & iron deficiency: - Platelet count elevated intermittently since 2008, ranging between 412-509. - No prior history of thrombosis.  No MI/CVA. - No prior history of connective tissue disorders.  She is a non-smoker. - No prior history of blood transfusion or intravenous iron. - Workup from 04/12/2022 ruled out B12 and folate deficiency and inflammatory process (normal CRP, ESR, LDH, ANA, RF). There is no evidence of driver mutations with negative JAK2, MPL and  CALR panel.  Iron panel showed borderline iron deficiency (ferritin 41, iron saturation 16%). - No B symptoms or recurrent infections.   - She reports heavy menstrual bleeding, but denies any rectal bleeding or melena. - She was taking daily iron + vitamin B12 500 mcg every other day, but stopped taking this about 2 months ago - Most recent labs (04/18/2023): Hgb 12.6/MCV 86.0.  Platelets mildly elevated 431 Ferritin trending downward at 35, iron saturation 27%.   B12 improved to 367, MMA normal - PLAN: We will hold off on IV iron, since patient is not anemic and is asymptomatic. - Recommend restarting daily/EOD iron supplement + vitamin B12 500 mcg EOD. - Labs (CBC and iron panel) and RTC with OFFICE visit in 6 months. Will check B12/MMA in 1 year (April 2026)    2.  Social/family history: - She works as a Freight forwarder at [employer].  No chemical exposure.  Non-smoker. - Father died of lung cancer.  Great uncle had cancer.  No current 2 tissue disorders in the family.  PLAN SUMMARY:  >> Labs in 6 months = ferritin, iron/TIBC, CBC/D >> OFFICE visit in 6 months (1 week after labs)     REVIEW OF SYSTEMS:   Review of Systems  Constitutional:  Negative for chills, diaphoresis, fever, malaise/fatigue and weight loss.  Respiratory:  Negative for cough and shortness of breath.   Cardiovascular:  Negative for chest pain and palpitations.  Gastrointestinal:  Positive for constipation and diarrhea. Negative for abdominal pain, blood in stool, melena, nausea and vomiting.  Neurological:  Negative for dizziness and headaches.  PHYSICAL EXAM: (per limitations of virtual telephone visit)  The patient is alert and oriented x 3, exhibiting adequate mentation, good mood, and ability to speak in full sentences and execute sound judgement.  WRAP UP:   I discussed the assessment and treatment plan with the patient. The patient was provided an opportunity to ask questions and all  were answered. The patient agreed with the plan and demonstrated an understanding of the instructions.   The patient was advised to call back or seek an in-person evaluation if the symptoms worsen or if the condition fails to improve as anticipated.  I provided 18 minutes of non-face-to-face time during this encounter, including >10 minutes of medical discussion.  Sonnie Dusky, PA-C 04/25/2023 8:54 AM

## 2023-04-25 ENCOUNTER — Inpatient Hospital Stay: Payer: PRIVATE HEALTH INSURANCE | Admitting: Physician Assistant

## 2023-04-25 DIAGNOSIS — E611 Iron deficiency: Secondary | ICD-10-CM

## 2023-04-25 DIAGNOSIS — E538 Deficiency of other specified B group vitamins: Secondary | ICD-10-CM | POA: Diagnosis not present

## 2023-04-25 DIAGNOSIS — D75839 Thrombocytosis, unspecified: Secondary | ICD-10-CM

## 2023-06-23 ENCOUNTER — Ambulatory Visit: Payer: PRIVATE HEALTH INSURANCE

## 2023-06-23 VITALS — BP 123/70 | HR 100 | Ht 63.0 in | Wt 184.1 lb

## 2023-06-23 DIAGNOSIS — H1132 Conjunctival hemorrhage, left eye: Secondary | ICD-10-CM

## 2023-06-23 NOTE — Progress Notes (Signed)
 Established Patient Office Visit  Subjective   Patient ID: [name], female    DOB: 1975-12-15  Age: 48 y.o. 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  Constitutional:      Appearance: Normal appearance.   Eyes:     General: Lids are normal. Lids are everted, no foreign bodies appreciated. Vision grossly intact. Gaze aligned appropriately.     Extraocular Movements: Extraocular movements intact.     Conjunctiva/sclera:     Right eye: No hemorrhage.    Left eye: Hemorrhage present.     Pupils: Pupils are equal, round, and reactive to light.    Cardiovascular:     Rate and Rhythm: Normal rate and regular rhythm.  Pulmonary:     Effort: Pulmonary effort is normal.     Breath sounds: Normal breath sounds.   Musculoskeletal:     Cervical back: Normal range of motion and neck supple.   Neurological:     Mental Status: She is alert and oriented to person,  place, and time.   Psychiatric:        Mood and Affect: Mood normal.        Thought Content: Thought content normal.      No results found for any visits on 06/23/23.  Last CBC Lab Results  Component Value Date   WBC 8.5 04/18/2023   HGB 12.6 04/18/2023   HCT 40.7 04/18/2023   MCV 86.0 04/18/2023   MCH 26.6 04/18/2023   RDW 14.3 04/18/2023   PLT 431 (H) 04/18/2023   Last metabolic panel Lab Results  Component Value Date   GLUCOSE 103 (H) 08/12/2022   NA 137 08/12/2022   K 3.1 (L) 08/12/2022   CL 104 08/12/2022   CO2 24 08/12/2022   BUN 12 08/12/2022   CREATININE 0.79 08/12/2022   GFRNONAA >60 08/12/2022   CALCIUM 9.1 08/12/2022   PROT 7.0 08/04/2022   ALBUMIN 3.7 08/04/2022   LABGLOB 2.5 05/25/2021   AGRATIO 1.6 05/25/2021   BILITOT 0.4 08/04/2022   ALKPHOS 55 08/04/2022   AST 16 08/04/2022   ALT 10 08/04/2022   ANIONGAP 9 08/12/2022      The 10-year ASCVD risk score (Arnett DK, et al., 2019) is: 0.9%    Assessment & Plan:  Problem List Items Addressed This Visit   None Visit Diagnoses       Conjunctival hemorrhage of left eye    -  Primary   Reassured pt that no treatment needed and should resolve with time.  Continue with dry eye drops and ointment.  Return precautions discussed.       Return for as needed.    Alison Irvine, FNP

## 2023-06-30 ENCOUNTER — Ambulatory Visit: Payer: PRIVATE HEALTH INSURANCE | Admitting: Family Medicine

## 2023-06-30 ENCOUNTER — Other Ambulatory Visit (HOSPITAL_COMMUNITY): Payer: Self-pay | Admitting: Family Medicine

## 2023-06-30 ENCOUNTER — Encounter: Payer: Self-pay | Admitting: Family Medicine

## 2023-06-30 VITALS — BP 108/71 | HR 75 | Resp 18 | Ht 63.0 in | Wt 184.1 lb

## 2023-06-30 DIAGNOSIS — E785 Hyperlipidemia, unspecified: Secondary | ICD-10-CM

## 2023-06-30 DIAGNOSIS — E66811 Obesity, class 1: Secondary | ICD-10-CM | POA: Diagnosis not present

## 2023-06-30 DIAGNOSIS — Z9109 Other allergy status, other than to drugs and biological substances: Secondary | ICD-10-CM

## 2023-06-30 DIAGNOSIS — Z1329 Encounter for screening for other suspected endocrine disorder: Secondary | ICD-10-CM

## 2023-06-30 DIAGNOSIS — E559 Vitamin D deficiency, unspecified: Secondary | ICD-10-CM | POA: Diagnosis not present

## 2023-06-30 DIAGNOSIS — Z1231 Encounter for screening mammogram for malignant neoplasm of breast: Secondary | ICD-10-CM

## 2023-06-30 DIAGNOSIS — L309 Dermatitis, unspecified: Secondary | ICD-10-CM

## 2023-06-30 DIAGNOSIS — K219 Gastro-esophageal reflux disease without esophagitis: Secondary | ICD-10-CM

## 2023-06-30 NOTE — Assessment & Plan Note (Signed)
 Npo current or recent flare

## 2023-06-30 NOTE — Assessment & Plan Note (Signed)
 Asymptomatic and currently on no medication

## 2023-06-30 NOTE — Assessment & Plan Note (Signed)
 Updated lab needed at/ before next visit.

## 2023-06-30 NOTE — Progress Notes (Signed)
   [name]     MRN: [id number]      DOB: 09/30/1975  Chief Complaint  Patient presents with   Medical Management of Chronic Issues    4 month follow up     HPI [name] is here for follow up and re-evaluation of chronic medical conditions, medication management and review of any available recent lab and radiology data.  Preventive health is updated, specifically  Cancer screening and Immunization.   Improved left subconjunctival hemorrhage  10 pound weight regain in 2 months and has been overindulging in cake The PT denies any adverse reactions to current medications since the last visit.  Increasing the weights she is lifting may have triggered the hemmorhage , encouraged to inc aerobic exercise    ROS Denies recent fever or chills. Denies sinus pressure, nasal congestion, ear pain or sore throat. Denies chest congestion, productive cough or wheezing. Denies chest pains, palpitations and leg swelling Denies abdominal pain, nausea, vomiting,diarrhea or constipation.   Denies dysuria, frequency, hesitancy or incontinence. Denies joint pain, swelling and limitation in mobility. Denies headaches, seizures, numbness, or tingling. Denies depression, anxiety or insomnia. Denies skin break down or rash.   PE  BP 108/71   Pulse 75   Resp 18   Ht 5' 3 (1.6 m)   Wt 184 lb 1.9 oz (83.5 kg)   LMP 06/22/2023 (Exact Date)   SpO2 98%   BMI 32.62 kg/m   Patient alert and oriented and in no cardiopulmonary distress.  HEENT: No facial asymmetry, EOMI,     Neck supple .  Chest: Clear to auscultation bilaterally.  CVS: S1, S2 no murmurs, no S3.Regular rate.  ABD: Soft non tender.   Ext: No edema  MS: Adequate ROM spine, shoulders, hips and knees.  Skin: Intact, no ulcerations or rash noted.  Psych: Good eye contact, normal affect. Memory intact not anxious or depressed appearing.  CNS: CN 2-12 intact, power,  normal throughout.no focal deficits  noted.   Assessment & Plan  Obesity (BMI 30.0-34.9)  Patient re-educated about  the importance of commitment to a  minimum of 150 minutes of exercise per week as able.  The importance of healthy food choices with portion control discussed, as well as eating regularly and within a 12 hour window most days. The need to choose clean , green food 50 to 75% of the time is discussed, as well as to make water the primary drink and set a goal of 64 ounces water daily.       06/30/2023    8:01 AM 06/23/2023    4:22 PM 03/02/2023    8:13 AM  Weight /BMI  Weight 184 lb 1.9 oz 184 lb 1.9 oz 174 lb  Height 5' 3 (1.6 m) 5' 3 (1.6 m) 5' 3 (1.6 m)  BMI 32.62 kg/m2 32.62 kg/m2 30.82 kg/m2    May need to resume medication if fails to lose weight or continues to gain weight   Environmental allergies Controlled on as needed medication  Vitamin D  deficiency Updated lab needed at/ before next visit.   GERD Asymptomatic and currently on no medication  Eczema Npo current or recent flare

## 2023-06-30 NOTE — Patient Instructions (Addendum)
 Follow-up in 4 months, call if you need me sooner.  Please schedule mammogram at checkout  Weight loss goal of 10 pounds in the next 4 months.  Labs this morning at [facility] that was ordered in February.  If you continue to gain weight please send a message for me to resume prescribing medication for weight loss.  Reconsider vaccine Nations and their  value.  Increase aerobic exercise rather than continually increasing weights.  Thanks for choosing [facility], we consider it a privelige to serve you.

## 2023-06-30 NOTE — Assessment & Plan Note (Signed)
 Controlled on as needed medication

## 2023-06-30 NOTE — Assessment & Plan Note (Signed)
  Patient re-educated about  the importance of commitment to a  minimum of 150 minutes of exercise per week as able.  The importance of healthy food choices with portion control discussed, as well as eating regularly and within a 12 hour window most days. The need to choose clean , green food 50 to 75% of the time is discussed, as well as to make water the primary drink and set a goal of 64 ounces water daily.       06/30/2023    8:01 AM 06/23/2023    4:22 PM 03/02/2023    8:13 AM  Weight /BMI  Weight 184 lb 1.9 oz 184 lb 1.9 oz 174 lb  Height 5' 3 (1.6 m) 5' 3 (1.6 m) 5' 3 (1.6 m)  BMI 32.62 kg/m2 32.62 kg/m2 30.82 kg/m2    May need to resume medication if fails to lose weight or continues to gain weight

## 2023-08-25 ENCOUNTER — Encounter: Payer: Self-pay | Admitting: Family Medicine

## 2023-08-25 MED ORDER — WEGOVY 0.25 MG/0.5ML ~~LOC~~ SOAJ: 0.2500 mg | SUBCUTANEOUS | 0 refills | Status: DC

## 2023-08-25 MED ORDER — WEGOVY 0.5 MG/0.5ML ~~LOC~~ SOAJ: 0.5000 mg | SUBCUTANEOUS | 2 refills | Status: DC

## 2023-09-16 ENCOUNTER — Other Ambulatory Visit: Payer: Self-pay | Admitting: Family Medicine

## 2023-09-18 ENCOUNTER — Other Ambulatory Visit: Payer: Self-pay | Admitting: Internal Medicine

## 2023-09-18 ENCOUNTER — Other Ambulatory Visit (HOSPITAL_COMMUNITY): Payer: Self-pay

## 2023-09-18 MED ORDER — WEGOVY 0.5 MG/0.5ML ~~LOC~~ SOAJ: 0.5000 mg | SUBCUTANEOUS | 0 refills | Status: DC

## 2023-09-19 ENCOUNTER — Other Ambulatory Visit (HOSPITAL_COMMUNITY): Payer: Self-pay

## 2023-09-20 ENCOUNTER — Other Ambulatory Visit (HOSPITAL_COMMUNITY): Payer: Self-pay

## 2023-09-21 ENCOUNTER — Other Ambulatory Visit (HOSPITAL_COMMUNITY): Payer: Self-pay

## 2023-09-21 ENCOUNTER — Encounter: Payer: Self-pay | Admitting: Family Medicine

## 2023-09-22 ENCOUNTER — Telehealth: Payer: Self-pay

## 2023-09-22 ENCOUNTER — Other Ambulatory Visit (HOSPITAL_COMMUNITY): Payer: Self-pay

## 2023-09-22 NOTE — Telephone Encounter (Signed)
 Copied from CRM #[id number]. Topic: Clinical - Medication Prior Auth >> Sep 22, 2023  3:13 PM Donna BRAVO wrote: Reason for CRM: patient called in stating the pharmacy requires an authorization for medication WEGOVY  0.5 MG/0.5ML SOAJ SQ injection   [id number] - [place], [place] - [address] [place] [address] Phone: [phone] Fax: [phone]

## 2023-09-25 ENCOUNTER — Telehealth: Payer: Self-pay | Admitting: Pharmacy Technician

## 2023-09-25 ENCOUNTER — Other Ambulatory Visit (HOSPITAL_COMMUNITY): Payer: Self-pay

## 2023-09-25 NOTE — Telephone Encounter (Signed)
 PA request has been Received. New Encounter has been or will be created for follow up. For additional info see Pharmacy Prior Auth telephone encounter from 09/25/2023.

## 2023-09-25 NOTE — Telephone Encounter (Signed)
 Spoke to [facility] they did receive a paid claim as well with a copay of $24.99.

## 2023-09-25 NOTE — Telephone Encounter (Signed)
 Pharmacy Patient Advocate Encounter   Received notification from Pt Calls Messages that prior authorization for Wegovy  0.5mg /0.57ml auto-injectors is required/requested.   Insurance verification completed.   The patient is insured through [employer] .   Per test claim: The current 28 day co-pay is, $24.99.  No PA needed at this time. This test claim was processed through [facility]- copay amounts may vary at other pharmacies due to pharmacy/plan contracts, or as the patient moves through the different stages of their insurance plan.    Claim was rejecting for prior authorization in error. This has been corrected. No further PA needed at this time.

## 2023-09-28 ENCOUNTER — Telehealth: Payer: Self-pay | Admitting: Family Medicine

## 2023-09-28 ENCOUNTER — Ambulatory Visit: Payer: Self-pay

## 2023-09-28 NOTE — Telephone Encounter (Signed)
 FYI Only or Action Required?: FYI only for provider.  Patient was last seen in primary care on 06/30/2023 by [name], MD.  Called Nurse Triage reporting Mass.  Symptoms began a week ago.  Interventions attempted: OTC medications: triple antibiotic ointment.  Symptoms are: gradually worsening.  Triage Disposition: See Physician Within 24 Hours  Patient/caregiver understands and will follow disposition?: Yes   Reason for Disposition  Genital area looks infected (e.g., draining sore, spreading redness)  Answer Assessment - Initial Assessment Questions 1. SYMPTOM: What's the main symptom you're concerned about? (e.g., pain, itching, dryness)     Irritation and lump-small pimple like lump 2. LOCATION: Where is the  lump located? (e.g., inside/outside, left/right)     Vagina-external 3. ONSET: When did the  lump  start?     09/19/23 4. PAIN: Is there any pain? If Yes, ask: How bad is it? (Scale: 1-10; mild, moderate, severe)     irritated 5. ITCHING: Is there any itching? If Yes, ask: How bad is it? (Scale: 1-10; mild, moderate, severe)     yes 6. CAUSE: What do you think is causing the discharge? Have you had the same problem before? What happened then?     Unsure-yeast 7. OTHER SYMPTOMS: Do you have any other symptoms? (e.g., fever, itching, vaginal bleeding, pain with urination, injury to genital area, vaginal foreign body)     Denies. She is on her menstrual cycle  Protocols used: Vaginal Symptoms-A-AH

## 2023-09-28 NOTE — Telephone Encounter (Signed)
 First attempt; no answer  Summary: Vaginal irritation, no appt   Pt called reporting that she had a bump on her vagina, has used ointment to treat that has irritated her more. Seeking appt, however nothing is available  Best contact: [phone]

## 2023-09-28 NOTE — Telephone Encounter (Unsigned)
 Copied from CRM [id number]. Topic: Clinical - Pink Word Triage >> Sep 28, 2023 12:03 PM Rosaria E wrote: Reason for Triage: Pt called reporting that she had a bump on her vagina, has used ointment to treat that has irritated her more. Seeking appt, however nothing is available   Best contact: [phone] >> Sep 28, 2023 12:05 PM Rosaria E wrote: Pt called reporting that she had a bump on her vagina, has used ointment to treat that has irritated her more. Seeking appt, however nothing is available   Best contact: [phone]

## 2023-09-28 NOTE — Telephone Encounter (Signed)
 [name]

## 2023-09-28 NOTE — Telephone Encounter (Signed)
 Appt scheduled

## 2023-09-29 ENCOUNTER — Ambulatory Visit: Payer: Self-pay | Admitting: Family Medicine

## 2023-09-29 ENCOUNTER — Encounter: Payer: Self-pay | Admitting: Family Medicine

## 2023-09-29 VITALS — BP 133/73 | HR 91 | Temp 97.7°F | Ht 63.0 in | Wt 184.0 lb

## 2023-09-29 DIAGNOSIS — N764 Abscess of vulva: Secondary | ICD-10-CM

## 2023-09-29 DIAGNOSIS — H1031 Unspecified acute conjunctivitis, right eye: Secondary | ICD-10-CM

## 2023-09-29 DIAGNOSIS — N760 Acute vaginitis: Secondary | ICD-10-CM

## 2023-09-29 LAB — WET PREP FOR TRICH, YEAST, CLUE
Clue Cell Exam: NEGATIVE
Trichomonas Exam: NEGATIVE
Yeast Exam: NEGATIVE

## 2023-09-29 MED ORDER — ERYTHROMYCIN 5 MG/GM OP OINT: 1.0000 | TOPICAL_OINTMENT | Freq: Every day | OPHTHALMIC | 0 refills | Status: AC

## 2023-09-29 MED ORDER — DOXYCYCLINE HYCLATE 100 MG PO TABS: 100.0000 mg | ORAL_TABLET | Freq: Two times a day (BID) | ORAL | 0 refills | Status: AC

## 2023-09-29 MED ORDER — FLUCONAZOLE 150 MG PO TABS: 150.0000 mg | ORAL_TABLET | Freq: Once | ORAL | 0 refills | Status: AC

## 2023-09-29 NOTE — Patient Instructions (Signed)
 Warm sitz bath's or warm compresses 2-3 times per day Start oral antibiotic.  Take with food. Wear sunscreen if going to be exposed to a lot of sun

## 2023-09-29 NOTE — Progress Notes (Signed)
 Subjective: CC: Vaginitis PCP: [name], MD [name] is a 48 y.o. female presenting to clinic today for:  Vaginitis Patient reports onset of vaginitis and what appears to be a boil on the left inner labia last Monday.  She notes that it burst shortly after forming.  It seems to be having purulent material coming out of it.  She also reports that she started utilizing some Neosporin in the area and then started developing some vaginal irritation and what she thinks is a change in her discharge.  Denies any odors to the discharge including fishy odor or yeasty odor.  She is prone to yeast infections when she takes oral antibiotics.  Her discharge has gotten more normal since that time.  She is currently menstruating.  She is sexually active with her spouse.  No concerns for STIs  Right eye irritation She reports right eye irritation that started this morning.  She woke up with matting of her eyelashes and a lot of discharge in the right eye.  She suffers from chronic dry eye and is on a lubricating drop and lubricating eye ointment daily.  Denies any blurred vision, pain with eye movement, fevers but she does have slight swelling at the inferior aspect of the eye lid.  No known ductal issues per her optometrist   ROS: Per HPI  Allergies  Allergen Reactions   Shellfish Allergy Hives and Shortness Of Breath   Past Medical History:  Diagnosis Date   Allergy    late Spring/early Summer   Chronic constipation    GERD (gastroesophageal reflux disease)    Hx of varicella    Hyperglycemia    IBS (irritable bowel syndrome)     Current Outpatient Medications:    Chlorphen-PE-Acetaminophen  (NOREL AD) 4-10-325 MG TABS, Take 1 tablet by mouth 3 (three) times daily as needed (Nasal congestion)., Disp: 30 tablet, Rfl: 0   Cholecalciferol (VITAMIN D3) 1000 units CAPS, Take by mouth., Disp: , Rfl:    clotrimazole -betamethasone  (LOTRISONE ) cream, Apply 1 Application  topically 2 (two) times daily., Disp: 45 g, Rfl: 1   ferrous sulfate  325 (65 FE) MG EC tablet, Take 1 tablet (325 mg total) by mouth daily with breakfast., Disp: 30 tablet, Rfl: 3   fluticasone  (FLONASE) 50 MCG/ACT nasal spray, Place 1 spray into both nostrils daily as needed., Disp: , Rfl:    ketoconazole  (NIZORAL ) 2 % shampoo, Apply 1 Application topically 2 (two) times a week., Disp: 120 mL, Rfl: 1   MULTIPLE VITAMIN PO, Take by mouth., Disp: , Rfl:    polyethylene glycol (MIRALAX / GLYCOLAX) packet, Take 17 g by mouth daily., Disp: , Rfl:    WEGOVY  0.5 MG/0.5ML SOAJ SQ injection, Inject 0.5 mg into the skin once a week., Disp: 2 mL, Rfl: 0 Social History   Socioeconomic History   Marital status: Single    Spouse name: Not on file   Number of children: Not on file   Years of education: Not on file   Highest education level: Bachelor's degree (e.g., BA, AB, BS)  Occupational History   Not on file  Tobacco Use   Smoking status: Never   Smokeless tobacco: Never  Vaping Use   Vaping status: Never Used  Substance and Sexual Activity   Alcohol use: Yes    Comment: occ- 1-2 drinks a month.   Drug use: No   Sexual activity: Yes    Birth control/protection: None  Other Topics Concern   Not on file  Social History Narrative   Not on file   Social Drivers of Health   Financial Resource Strain: Low Risk  (09/28/2023)   Overall Financial Resource Strain (CARDIA)    Difficulty of Paying Living Expenses: Not hard at all  Food Insecurity: No Food Insecurity (09/28/2023)   Hunger Vital Sign    Worried About Running Out of Food in the Last Year: Never true    Ran Out of Food in the Last Year: Never true  Transportation Needs: No Transportation Needs (09/28/2023)   PRAPARE - Administrator, Civil Service (Medical): No    Lack of Transportation (Non-Medical): No  Physical Activity: Insufficiently Active (09/28/2023)   Exercise Vital Sign    Days of Exercise per Week: 2 days     Minutes of Exercise per Session: 30 min  Stress: No Stress Concern Present (09/28/2023)   [employer] of Occupational Health - Occupational Stress Questionnaire    Feeling of Stress: Not at all  Social Connections: Moderately Integrated (09/28/2023)   Social Connection and Isolation Panel    Frequency of Communication with Friends and Family: More than three times a week    Frequency of Social Gatherings with Friends and Family: More than three times a week    Attends Religious Services: More than 4 times per year    Active Member of [employer] or Organizations: Yes    Attends Engineer, structural: More than 4 times per year    Marital Status: Never married  Catering manager Violence: Not on file   Family History  Problem Relation Age of Onset   Hypothyroidism Mother    Hyperlipidemia Mother    Anemia Mother    Cancer Father        lung   Diabetes Father    Hypertension Sister    Diabetes Maternal Aunt    Heart attack Maternal Aunt    Hypertension Paternal Aunt    Diabetes Paternal Aunt    Heart attack Paternal Grandfather    Heart disease Paternal Aunt    Colon cancer Neg Hx    Colon polyps Neg Hx     Objective: Office vital signs reviewed. BP 133/73   Pulse 91   Temp 97.7 F (36.5 C)   Ht 5' 3 (1.6 m)   Wt 184 lb (83.5 kg)   SpO2 100%   BMI 32.59 kg/m   Physical Examination:  General: Awake, alert, well nourished, No acute distress HEENT: Scleral injection, particular along the medial aspect of the sclera.  She has very slight soft tissue swelling along the lower lid.  No overt purulent material appreciated within the eye.  She has painless eye movement. GU: external vaginal tissue atrophic, bleeding within the vaginal vault.  Left labia minora with an abscess at the inferior aspect of the labia.  Does not appear to be involving the Bartholin gland.  There is purulent material coming from that abscess.  It is tender to touch    Assessment/ Plan: 48 y.o.  female   Vulvar abscess - Plan: doxycycline  (VIBRA -TABS) 100 MG tablet, fluconazole  (DIFLUCAN ) 150 MG tablet  Acute vaginitis - Plan: WET PREP FOR TRICH, YEAST, CLUE, fluconazole  (DIFLUCAN ) 150 MG tablet  Acute bacterial conjunctivitis of right eye - Plan: erythromycin  ophthalmic ointment   Oral doxycycline  twice daily given purulent material.  Actively draining so no I&D performed.  Fluconazole  given for as needed use.  Wet prep without evidence of BV or yeast.  Discussed sitz baths or warm  compresses to promote drainage of abscess as above  Going to empirically treat as acute bacterial conjunctivitis with erythromycin  eye ointment.  Use as directed.  Red flag signs and symptoms discussed and reasons for reevaluation with optometry or ophthalmology reviewed.  She voiced good understanding will follow-up as needed   Norene CHRISTELLA Fielding, DO [facility] [phone]

## 2023-10-02 ENCOUNTER — Ambulatory Visit: Payer: Self-pay | Admitting: Family Medicine

## 2023-10-25 ENCOUNTER — Inpatient Hospital Stay: Payer: PRIVATE HEALTH INSURANCE | Attending: Physician Assistant

## 2023-10-25 DIAGNOSIS — Z801 Family history of malignant neoplasm of trachea, bronchus and lung: Secondary | ICD-10-CM | POA: Diagnosis not present

## 2023-10-25 DIAGNOSIS — E538 Deficiency of other specified B group vitamins: Secondary | ICD-10-CM

## 2023-10-25 DIAGNOSIS — E611 Iron deficiency: Secondary | ICD-10-CM | POA: Diagnosis not present

## 2023-10-25 DIAGNOSIS — D75839 Thrombocytosis, unspecified: Secondary | ICD-10-CM | POA: Diagnosis present

## 2023-10-25 DIAGNOSIS — N920 Excessive and frequent menstruation with regular cycle: Secondary | ICD-10-CM | POA: Insufficient documentation

## 2023-10-25 LAB — CBC WITH DIFFERENTIAL/PLATELET
Abs Immature Granulocytes: 0.02 K/uL (ref 0.00–0.07)
Basophils Absolute: 0.1 K/uL (ref 0.0–0.1)
Basophils Relative: 1 %
Eosinophils Absolute: 0.3 K/uL (ref 0.0–0.5)
Eosinophils Relative: 3 %
HCT: 39.1 % (ref 36.0–46.0)
Hemoglobin: 12.7 g/dL (ref 12.0–15.0)
Immature Granulocytes: 0 %
Lymphocytes Relative: 30 %
Lymphs Abs: 2.9 K/uL (ref 0.7–4.0)
MCH: 28.2 pg (ref 26.0–34.0)
MCHC: 32.5 g/dL (ref 30.0–36.0)
MCV: 86.9 fL (ref 80.0–100.0)
Monocytes Absolute: 0.6 K/uL (ref 0.1–1.0)
Monocytes Relative: 6 %
Neutro Abs: 5.9 K/uL (ref 1.7–7.7)
Neutrophils Relative %: 60 %
Platelets: 415 K/uL — ABNORMAL HIGH (ref 150–400)
RBC: 4.5 MIL/uL (ref 3.87–5.11)
RDW: 14.2 % (ref 11.5–15.5)
WBC: 9.7 K/uL (ref 4.0–10.5)
nRBC: 0 % (ref 0.0–0.2)

## 2023-10-25 LAB — IRON AND TIBC
Iron: 102 ug/dL (ref 28–170)
Saturation Ratios: 31 % (ref 10.4–31.8)
TIBC: 333 ug/dL (ref 250–450)
UIBC: 231 ug/dL

## 2023-10-25 LAB — FERRITIN: Ferritin: 109 ng/mL (ref 11–307)

## 2023-10-26 NOTE — Progress Notes (Signed)
 [facility] [address], [place] [address]   CLINIC:  Medical Oncology/Hematology  PCP:  [name], MD [address], [address] [place] [address] [phone]   REASON FOR VISIT:  Follow-up for thrombocytosis and iron deficiency   CURRENT THERAPY: Ferrous sulfate   INTERVAL HISTORY:  [name] (48 year old female) is seen in clinic today for follow-up of thrombocytosis and iron deficiency.  She was evaluated via telemedicine visit by Pleasant Barefoot PA-C on 04/25/2023. At today's visit, she reports feeling fairly well. She is taking vitamin B12 500 mcg every other day and iron supplement every other day. She continues to report heavy menses, but they are shorter than before.   She denies any rectal bleeding or melena. She denies any vasomotor symptoms, aquagenic pruritus, or vasomotor symptoms. No B symptoms or recurrent infections. She has 100% energy and 100% appetite. She endorses that she is maintaining a stable weight.  ASSESSMENT & PLAN:  1.  Thrombocytosis & iron deficiency: - Platelet count elevated intermittently since 2008, ranging between 412-509. - No prior history of thrombosis.  No MI/CVA. - No prior history of connective tissue disorders. - She is a non-smoker. - No prior history of blood transfusion or intravenous iron. - Workup from 04/12/2022 ruled out B12 and folate deficiency and inflammatory process (normal CRP, ESR, LDH, ANA, RF). There is no evidence of driver mutations with negative JAK2, MPL and CALR panel.  Iron panel showed borderline iron deficiency (ferritin 41, iron saturation 16%). - No B symptoms or recurrent infections.   - She reports heavy menstrual bleeding, but denies any rectal bleeding or melena. - She is taking vitamin B12 500 mcg every other day and iron supplement every other day. - Most recent labs (10/25/2023): Hgb 12.7/MCV 86.9.  Platelets mildly elevated 415 Ferritin normalized  at 109, iron saturation 31% B12 improved to 367, MMA normal - PLAN: No evidence of MPN.  Suspect mild reactive thrombocytosis. - Continue EOD iron supplement + vitamin B12 500 mcg EOD. - RTC in 1 year with office visit.  Labs to be checked prior (CBC/D, ferritin, iron/TIBC, B12, MMA) -- If labs are stable at follow-up, will consider discharge to PCP at that time   2.  Social/family history: - She works as a freight forwarder at [employer].  No chemical exposure.  Non-smoker. - Father died of lung cancer.  Great uncle had cancer.  No current 2 tissue disorders in the family.  PLAN SUMMARY:  >> Labs in 1 year = ferritin, iron/TIBC, B12, MMA, CBC/D >> OFFICE visit in 1 year (1 week after labs)     REVIEW OF SYSTEMS:   Review of Systems  Constitutional:  Negative for appetite change, chills, diaphoresis, fatigue, fever and unexpected weight change.  HENT:   Negative for lump/mass and nosebleeds.   Eyes:  Negative for eye problems.  Respiratory:  Negative for cough, hemoptysis and shortness of breath.   Cardiovascular:  Negative for chest pain, leg swelling and palpitations.  Gastrointestinal:  Negative for abdominal pain, blood in stool, constipation, diarrhea, nausea and vomiting.  Genitourinary:  Negative for hematuria.   Skin: Negative.   Neurological:  Negative for dizziness, headaches and light-headedness.  Hematological:  Does not bruise/bleed easily.     PHYSICAL EXAM:  ECOG PERFORMANCE STATUS: 0 - Asymptomatic  Vitals:   10/30/23 0833  BP: 120/79  Pulse: 64  Resp: 16  Temp: 97.9 F (36.6 C)  SpO2: 100%  Filed Weights   10/30/23 0833  Weight: 192 lb 7.4 oz (87.3 kg)   Physical Exam Constitutional:      Appearance: Normal appearance. She is obese.  Cardiovascular:     Heart sounds: Normal heart sounds.  Pulmonary:     Breath sounds: Normal breath sounds.  Neurological:     General: No focal deficit present.     Mental Status: Mental status  is at baseline.  Psychiatric:        Behavior: Behavior normal. Behavior is cooperative.    PAST MEDICAL/SURGICAL HISTORY:  Past Medical History:  Diagnosis Date   Allergy    late Spring/early Summer   Chronic constipation    GERD (gastroesophageal reflux disease)    Hx of varicella    Hyperglycemia    IBS (irritable bowel syndrome)    Past Surgical History:  Procedure Laterality Date   BREAST REDUCTION SURGERY     BREAST SURGERY Bilateral 01/04/2000   reduction   bunnionectomy Left 01/04/2004   COLONOSCOPY     Per patient, colonoscopy in 2005 with [facility] revealing normal exam.   COLONOSCOPY N/A 01/20/2021   Procedure: COLONOSCOPY;  Surgeon: Shaaron Lamar HERO, MD;  Location: [facility];  Service: Endoscopy;  Laterality: N/A;  9:30am   right ankle     TONSILLECTOMY  01/03/2002    SOCIAL HISTORY:  Social History   Socioeconomic History   Marital status: Single    Spouse name: Not on file   Number of children: Not on file   Years of education: Not on file   Highest education level: Bachelor's degree (e.g., BA, AB, BS)  Occupational History   Not on file  Tobacco Use   Smoking status: Never   Smokeless tobacco: Never  Vaping Use   Vaping status: Never Used  Substance and Sexual Activity   Alcohol use: Yes    Comment: occ- 1-2 drinks a month.   Drug use: No   Sexual activity: Yes    Birth control/protection: None  Other Topics Concern   Not on file  Social History Narrative   Not on file   Social Drivers of Health   Financial Resource Strain: Low Risk  (09/28/2023)   Overall Financial Resource Strain (CARDIA)    Difficulty of Paying Living Expenses: Not hard at all  Food Insecurity: No Food Insecurity (09/28/2023)   Hunger Vital Sign    Worried About Running Out of Food in the Last Year: Never true    Ran Out of Food in the Last Year: Never true  Transportation Needs: No Transportation Needs (09/28/2023)   PRAPARE - Scientist, Research (physical Sciences) (Medical): No    Lack of Transportation (Non-Medical): No  Physical Activity: Insufficiently Active (09/28/2023)   Exercise Vital Sign    Days of Exercise per Week: 2 days    Minutes of Exercise per Session: 30 min  Stress: No Stress Concern Present (09/28/2023)   [employer] of Occupational Health - Occupational Stress Questionnaire    Feeling of Stress: Not at all  Social Connections: Moderately Integrated (09/28/2023)   Social Connection and Isolation Panel    Frequency of Communication with Friends and Family: More than three times a week    Frequency of Social Gatherings with Friends and Family: More than three times a week    Attends Religious Services: More than 4 times per year    Active Member of [employer] or Organizations: Yes    Attends Banker Meetings:  More than 4 times per year    Marital Status: Never married  Intimate Partner Violence: Not on file    FAMILY HISTORY:  Family History  Problem Relation Age of Onset   Hypothyroidism Mother    Hyperlipidemia Mother    Anemia Mother    Cancer Father        lung   Diabetes Father    Hypertension Sister    Diabetes Maternal Aunt    Heart attack Maternal Aunt    Hypertension Paternal Aunt    Diabetes Paternal Aunt    Heart attack Paternal Grandfather    Heart disease Paternal Aunt    Colon cancer Neg Hx    Colon polyps Neg Hx     CURRENT MEDICATIONS:  Outpatient Encounter Medications as of 10/30/2023  Medication Sig Note   Chlorphen-PE-Acetaminophen  (NOREL AD) 4-10-325 MG TABS Take 1 tablet by mouth 3 (three) times daily as needed (Nasal congestion).    Cholecalciferol (VITAMIN D3) 1000 units CAPS Take by mouth.    clotrimazole -betamethasone  (LOTRISONE ) cream Apply 1 Application topically 2 (two) times daily.    ferrous sulfate  325 (65 FE) MG EC tablet Take 1 tablet (325 mg total) by mouth daily with breakfast. 03/02/2023: Takes alternate days reported 03/02/2023   fluticasone  (FLONASE)  50 MCG/ACT nasal spray Place 1 spray into both nostrils daily as needed.    ketoconazole  (NIZORAL ) 2 % shampoo Apply 1 Application topically 2 (two) times a week.    MULTIPLE VITAMIN PO Take by mouth.    polyethylene glycol (MIRALAX / GLYCOLAX) packet Take 17 g by mouth daily.    WEGOVY  0.5 MG/0.5ML SOAJ SQ injection Inject 0.5 mg into the skin once a week.    No facility-administered encounter medications on file as of 10/30/2023.    ALLERGIES:  Allergies  Allergen Reactions   Shellfish Allergy Hives and Shortness Of Breath    LABORATORY DATA:  I have reviewed the labs as listed.  CBC    Component Value Date/Time   WBC 9.7 10/25/2023 0933   RBC 4.50 10/25/2023 0933   HGB 12.7 10/25/2023 0933   HGB 12.5 05/25/2021 0929   HCT 39.1 10/25/2023 0933   HCT 39.0 05/25/2021 0929   PLT 415 (H) 10/25/2023 0933   PLT 417 05/25/2021 0929   MCV 86.9 10/25/2023 0933   MCV 85 05/25/2021 0929   MCH 28.2 10/25/2023 0933   MCHC 32.5 10/25/2023 0933   RDW 14.2 10/25/2023 0933   RDW 13.1 05/25/2021 0929   LYMPHSABS 2.9 10/25/2023 0933   MONOABS 0.6 10/25/2023 0933   EOSABS 0.3 10/25/2023 0933   BASOSABS 0.1 10/25/2023 0933      Latest Ref Rng & Units 08/12/2022    9:31 PM 08/04/2022   11:11 AM 03/31/2022    7:16 AM  CMP  Glucose 70 - 99 mg/dL 896  89  84   BUN 6 - 20 mg/dL 12  9  11    Creatinine 0.44 - 1.00 mg/dL 9.20  9.20  9.23   Sodium 135 - 145 mmol/L 137  135  139   Potassium 3.5 - 5.1 mmol/L 3.1  3.5  4.2   Chloride 98 - 111 mmol/L 104  102  106   CO2 22 - 32 mmol/L 24  24  24    Calcium 8.9 - 10.3 mg/dL 9.1  8.3  8.8   Total Protein 6.5 - 8.1 g/dL  7.0  6.8   Total Bilirubin 0.3 - 1.2 mg/dL  0.4  0.4  Alkaline Phos 38 - 126 U/L  55    AST 15 - 41 U/L  16  12   ALT 0 - 44 U/L  10  5     DIAGNOSTIC IMAGING:  I have independently reviewed the relevant imaging and discussed with the patient.   WRAP UP:  All questions were answered. The patient knows to call the clinic with  any problems, questions or concerns.  Medical decision making: Low  Time spent on visit: I spent 15 minutes counseling the patient face to face. The total time spent in the appointment was 22 minutes and more than 50% was on counseling.  Pleasant CHRISTELLA Barefoot, PA-C  10/30/23 9:19 AM

## 2023-10-30 ENCOUNTER — Inpatient Hospital Stay: Payer: PRIVATE HEALTH INSURANCE | Admitting: Physician Assistant

## 2023-10-30 VITALS — BP 120/79 | HR 64 | Temp 97.9°F | Resp 16 | Wt 192.5 lb

## 2023-10-30 DIAGNOSIS — E611 Iron deficiency: Secondary | ICD-10-CM | POA: Diagnosis not present

## 2023-10-30 DIAGNOSIS — E538 Deficiency of other specified B group vitamins: Secondary | ICD-10-CM

## 2023-10-30 DIAGNOSIS — D75839 Thrombocytosis, unspecified: Secondary | ICD-10-CM | POA: Diagnosis not present

## 2023-10-30 NOTE — Patient Instructions (Signed)
 [facility] at [facility] **VISIT SUMMARY & IMPORTANT INSTRUCTIONS **   You were seen today by Pleasant Barefoot PA-C for your iron deficiency and elevated platelets.    ELEVATED PLATELETS (THROMBOCYTOSIS) Your elevated platelets are mildly elevated, but stable. You did not have any evidence of cancerous causes of high platelets.  IRON DEFICIENCY Your iron levels have improved. Continue to take iron tablets every day.  B12 DEFICIENCY Your labs showed borderline vitamin B12 levels. Continue taking vitamin B12 500 mcg every day.  FOLLOW-UP APPOINTMENT: 1 year  ** Thank you for trusting me with your healthcare!  I strive to provide all of my patients with quality care at each visit.  If you receive a survey for this visit, I would be so grateful to you for taking the time to provide feedback.  Thank you in advance!  ~ Manolito Jurewicz                                        Dr. Mickiel Davonna Pleasant Barefoot, PA-C      Delon Hope, NP   - - - - - - - - - - - - - - - - - -     Thank you for choosing [facility] at [facility] to provide your oncology and hematology care.  To afford each patient quality time with our provider, please arrive at least 15 minutes before your scheduled appointment time.   If you have a lab appointment with the Cancer Center please come in thru the Main Entrance and check in at the main information desk.  You need to re-schedule your appointment should you arrive 10 or more minutes late.  We strive to give you quality time with our providers, and arriving late affects you and other patients whose appointments are after yours.  Also, if you no show three or more times for appointments you may be dismissed from the clinic at the providers discretion.     Again, thank you for choosing [facility].  Our hope is that these requests will decrease the amount of time that you wait before being seen by our  physicians.       _____________________________________________________________  Should you have questions after your visit to [facility], please contact our office at [phone] and follow the prompts.  Our office hours are 8:00 a.m. and 4:30 p.m. Monday - Friday.  Please note that voicemails left after 4:00 p.m. may not be returned until the following business day.  We are closed weekends and major holidays.  You do have access to a nurse 24-7, just call the main number to the clinic [phone] and do not press any options, hold on the line and a nurse will answer the phone.    For prescription refill requests, have your pharmacy contact our office and allow 72 hours.

## 2023-11-01 ENCOUNTER — Ambulatory Visit: Payer: PRIVATE HEALTH INSURANCE | Admitting: Physician Assistant

## 2023-11-03 ENCOUNTER — Ambulatory Visit (INDEPENDENT_AMBULATORY_CARE_PROVIDER_SITE_OTHER): Payer: PRIVATE HEALTH INSURANCE

## 2023-11-03 DIAGNOSIS — Z23 Encounter for immunization: Secondary | ICD-10-CM

## 2023-11-03 NOTE — Progress Notes (Signed)
 Patient is in office today for a nurse visit for Immunization. Patient Injection was given in the  Right deltoid. Patient tolerated injection well.

## 2023-11-07 ENCOUNTER — Ambulatory Visit (INDEPENDENT_AMBULATORY_CARE_PROVIDER_SITE_OTHER): Payer: PRIVATE HEALTH INSURANCE | Admitting: Family Medicine

## 2023-11-07 ENCOUNTER — Encounter: Payer: Self-pay | Admitting: Family Medicine

## 2023-11-07 VITALS — BP 118/71 | HR 79 | Resp 16 | Ht 63.0 in | Wt 191.1 lb

## 2023-11-07 DIAGNOSIS — E785 Hyperlipidemia, unspecified: Secondary | ICD-10-CM | POA: Diagnosis not present

## 2023-11-07 DIAGNOSIS — Z9109 Other allergy status, other than to drugs and biological substances: Secondary | ICD-10-CM

## 2023-11-07 DIAGNOSIS — E66811 Obesity, class 1: Secondary | ICD-10-CM | POA: Diagnosis not present

## 2023-11-07 MED ORDER — SEMAGLUTIDE-WEIGHT MANAGEMENT 1 MG/0.5ML ~~LOC~~ SOAJ: 1.0000 mg | SUBCUTANEOUS | 0 refills | Status: AC

## 2023-11-07 MED ORDER — SEMAGLUTIDE-WEIGHT MANAGEMENT 1.7 MG/0.75ML ~~LOC~~ SOAJ: 1.7000 mg | SUBCUTANEOUS | 2 refills | Status: AC

## 2023-11-07 NOTE — Patient Instructions (Addendum)
 F/u in 14 weeks  New higher doses of wegovy   Need fasting labs in the morning  It is important that you exercise regularly at least 30 minutes 5 times a week. If you develop chest pain, have severe difficulty breathing, or feel very tired, stop exercising immediately and seek medical attention   Think about what you will eat, plan ahead. Choose  clean, green, fresh or frozen over canned, processed or packaged foods which are more sugary, salty and fatty. 70 to 75% of food eaten should be vegetables and fruit. Three meals at set times with snacks allowed between meals, but they must be fruit or vegetables. Aim to eat over a 12 hour period , example 7 am to 7 pm, and STOP after  your last meal of the day. Drink water,generally about 64 ounces per day, no other drink is as healthy. Fruit juice is best enjoyed in a healthy way, by EATING the fruit.   Thanks for choosing [facility], we consider it a privelige to serve you.

## 2023-11-07 NOTE — Assessment & Plan Note (Signed)
 Controlled, no change in medication

## 2023-11-07 NOTE — Assessment & Plan Note (Signed)
 Hyperlipidemia:Low fat diet discussed and encouraged.   Lipid Panel  Lab Results  Component Value Date   CHOL 147 03/31/2022   HDL 55 03/31/2022   LDLCALC 78 03/31/2022   TRIG 56 03/31/2022   CHOLHDL 2.7 03/31/2022     Updated lab needed at/ before next visit.

## 2023-11-07 NOTE — Assessment & Plan Note (Signed)
 Deteriorated inc dose wegovy  recommit to lifestyle change for weight loss  Patient re-educated about  the importance of commitment to a  minimum of 150 minutes of exercise per week as able.  The importance of healthy food choices with portion control discussed, as well as eating regularly and within a 12 hour window most days. The need to choose clean , green food 50 to 75% of the time is discussed, as well as to make water the primary drink and set a goal of 64 ounces water daily.       11/07/2023    4:06 PM 10/30/2023    8:33 AM 09/29/2023    8:22 AM  Weight /BMI  Weight 191 lb 1.9 oz 192 lb 7.4 oz 184 lb  Height 5' 3 (1.6 m)  5' 3 (1.6 m)  BMI 33.86 kg/m2 34.09 kg/m2 32.59 kg/m2

## 2023-11-07 NOTE — Progress Notes (Signed)
   [name]     MRN: [id number]      DOB: 1975-11-13  Chief Complaint  Patient presents with   Obesity    4 month follow up     HPI [name] is here for follow up and re-evaluation of chronic medical conditions, medication management and review of any available recent lab and radiology data.  Preventive health is updated, specifically  Cancer screening and Immunization.   C/o weight regain nad difficulty losiong weigthh wants to titrate up wegovy  dose ansd will also work on lifestyle change The PT denies any adverse reactions to current medications since the last visit.  There are no new concerns.  There are no specific complaints   ROS Denies recent fever or chills. Denies sinus pressure, nasal congestion, ear pain or sore throat. Denies chest congestion, productive cough or wheezing. Denies chest pains, palpitations and leg swelling Denies abdominal pain, nausea, vomiting,diarrhea or constipation.   Denies dysuria, frequency, hesitancy or incontinence. C/o intermittent left neck stiffness since covid, n, swelling and limitation in mobility. Denies headaches, seizures, numbness, or tingling. Denies depression, anxiety or insomnia. Denies skin break down or rash.   PE  BP 118/71   Pulse 79   Resp 16   Ht 5' 3 (1.6 m)   Wt 191 lb 1.9 oz (86.7 kg)   SpO2 99%   BMI 33.86 kg/m   Patient alert and oriented and in no cardiopulmonary distress.  HEENT: No facial asymmetry, EOMI,     Neck supple .  Chest: Clear to auscultation bilaterally.  CVS: S1, S2 no murmurs, no S3.Regular rate.  ABD: Soft non tender.   Ext: No edema  MS: Adequate ROM spine, shoulders, hips and knees.  Skin: Intact, no ulcerations or rash noted.  Psych: Good eye contact, normal affect. Memory intact not anxious or depressed appearing.  CNS: CN 2-12 intact, power,  normal throughout.no focal deficits noted.   Assessment & Plan  Obesity (BMI 30.0-34.9) Deteriorated inc dose  wegovy  recommit to lifestyle change for weight loss  Patient re-educated about  the importance of commitment to a  minimum of 150 minutes of exercise per week as able.  The importance of healthy food choices with portion control discussed, as well as eating regularly and within a 12 hour window most days. The need to choose clean , green food 50 to 75% of the time is discussed, as well as to make water the primary drink and set a goal of 64 ounces water daily.       11/07/2023    4:06 PM 10/30/2023    8:33 AM 09/29/2023    8:22 AM  Weight /BMI  Weight 191 lb 1.9 oz 192 lb 7.4 oz 184 lb  Height 5' 3 (1.6 m)  5' 3 (1.6 m)  BMI 33.86 kg/m2 34.09 kg/m2 32.59 kg/m2      Dyslipidemia Hyperlipidemia:Low fat diet discussed and encouraged.   Lipid Panel  Lab Results  Component Value Date   CHOL 147 03/31/2022   HDL 55 03/31/2022   LDLCALC 78 03/31/2022   TRIG 56 03/31/2022   CHOLHDL 2.7 03/31/2022     Updated lab needed at/ before next visit.   Environmental allergies Controlled, no change in medication

## 2023-11-08 ENCOUNTER — Telehealth: Payer: Self-pay

## 2023-11-08 NOTE — Telephone Encounter (Signed)
 Copied from CRM [id number]. Topic: Clinical - Lab/Test Results >> Nov 08, 2023  4:22 PM Selinda RAMAN wrote: Reason for CRM: The patient called in stating she was seen yesterday and was supposed to get an order for fasting labs to get done this morning at [facility]. She never received the order and needs to get it so she can get the labs done as soon as possible. Please assist patient further.

## 2023-11-08 NOTE — Telephone Encounter (Signed)
 Lvm that order is up front

## 2023-11-14 NOTE — Telephone Encounter (Signed)
 Patient picked up.

## 2023-11-15 ENCOUNTER — Ambulatory Visit: Payer: Self-pay | Admitting: Family Medicine

## 2023-11-15 LAB — COMPREHENSIVE METABOLIC PANEL WITH GFR
AG Ratio: 1.5 (calc) (ref 1.0–2.5)
ALT: 10 U/L (ref 6–29)
AST: 19 U/L (ref 10–35)
Albumin: 4.3 g/dL (ref 3.6–5.1)
Alkaline phosphatase (APISO): 46 U/L (ref 31–125)
BUN: 12 mg/dL (ref 7–25)
CO2: 26 mmol/L (ref 20–32)
Calcium: 9.3 mg/dL (ref 8.6–10.2)
Chloride: 103 mmol/L (ref 98–110)
Creat: 0.86 mg/dL (ref 0.50–0.99)
Globulin: 2.8 g/dL (ref 1.9–3.7)
Glucose, Bld: 85 mg/dL (ref 65–99)
Potassium: 4.5 mmol/L (ref 3.5–5.3)
Sodium: 139 mmol/L (ref 135–146)
Total Bilirubin: 1.3 mg/dL — ABNORMAL HIGH (ref 0.2–1.2)
Total Protein: 7.1 g/dL (ref 6.1–8.1)
eGFR: 83 mL/min/1.73m2 (ref >=60)

## 2023-11-15 LAB — LIPID PANEL
Cholesterol: 155 mg/dL
HDL: 71 mg/dL
LDL Cholesterol (Calc): 70 mg/dL
Non-HDL Cholesterol (Calc): 84 mg/dL
Total CHOL/HDL Ratio: 2.2 (calc)
Triglycerides: 68 mg/dL

## 2023-11-15 LAB — TSH+FREE T4: TSH W/REFLEX TO FT4: 2.06 m[IU]/L

## 2023-11-15 LAB — VITAMIN D 25 HYDROXY (VIT D DEFICIENCY, FRACTURES): Vit D, 25-Hydroxy: 40 ng/mL (ref 30–100)

## 2023-12-04 ENCOUNTER — Ambulatory Visit (HOSPITAL_COMMUNITY)
Admission: RE | Admit: 2023-12-04 | Discharge: 2023-12-04 | Disposition: A | Discharge status: Home / Self Care | Payer: PRIVATE HEALTH INSURANCE | Source: Ambulatory Visit | Attending: Family Medicine | Admitting: Family Medicine

## 2023-12-04 ENCOUNTER — Encounter (HOSPITAL_COMMUNITY): Payer: Self-pay

## 2023-12-04 DIAGNOSIS — Z1231 Encounter for screening mammogram for malignant neoplasm of breast: Secondary | ICD-10-CM | POA: Diagnosis present

## 2024-01-01 ENCOUNTER — Encounter: Payer: Self-pay | Admitting: *Deleted

## 2024-01-11 ENCOUNTER — Encounter: Payer: Self-pay | Admitting: Family Medicine

## 2024-01-12 MED ORDER — PANTOPRAZOLE SODIUM 20 MG PO TBEC: 20.0000 mg | DELAYED_RELEASE_TABLET | Freq: Every day | ORAL | 3 refills | Status: AC

## 2024-02-13 ENCOUNTER — Ambulatory Visit: Payer: PRIVATE HEALTH INSURANCE | Admitting: Family Medicine

## 2024-03-08 ENCOUNTER — Ambulatory Visit: Payer: PRIVATE HEALTH INSURANCE | Admitting: Family Medicine

## 2024-03-12 ENCOUNTER — Ambulatory Visit: Payer: PRIVATE HEALTH INSURANCE | Admitting: Family Medicine

## 2024-10-21 ENCOUNTER — Inpatient Hospital Stay: Payer: PRIVATE HEALTH INSURANCE

## 2024-10-28 ENCOUNTER — Inpatient Hospital Stay: Payer: PRIVATE HEALTH INSURANCE | Admitting: Physician Assistant
